# Patient Record
Sex: Female | Born: 1935 | Race: White | Hispanic: No | Smoking: Former smoker
Health system: Southern US, Community
[De-identification: ages and names within clinical notes are randomized; demographics above are authoritative.]

## PROBLEM LIST (undated history)

## (undated) DIAGNOSIS — I350 Nonrheumatic aortic (valve) stenosis: Secondary | ICD-10-CM

## (undated) DIAGNOSIS — M8580 Other specified disorders of bone density and structure, unspecified site: Secondary | ICD-10-CM

## (undated) DIAGNOSIS — I700 Atherosclerosis of aorta: Secondary | ICD-10-CM

## (undated) DIAGNOSIS — F32A Depression, unspecified: Secondary | ICD-10-CM

## (undated) DIAGNOSIS — N183 Chronic kidney disease, stage 3 unspecified: Secondary | ICD-10-CM

## (undated) DIAGNOSIS — E039 Hypothyroidism, unspecified: Secondary | ICD-10-CM

## (undated) DIAGNOSIS — K219 Gastro-esophageal reflux disease without esophagitis: Secondary | ICD-10-CM

## (undated) DIAGNOSIS — Z9889 Other specified postprocedural states: Secondary | ICD-10-CM

## (undated) DIAGNOSIS — I6523 Occlusion and stenosis of bilateral carotid arteries: Secondary | ICD-10-CM

## (undated) DIAGNOSIS — M1990 Unspecified osteoarthritis, unspecified site: Secondary | ICD-10-CM

## (undated) DIAGNOSIS — I10 Essential (primary) hypertension: Secondary | ICD-10-CM

## (undated) DIAGNOSIS — Z9289 Personal history of other medical treatment: Secondary | ICD-10-CM

## (undated) HISTORY — DX: Other specified postprocedural states: Z98.890

## (undated) HISTORY — DX: Occlusion and stenosis of bilateral carotid arteries: I65.23

## (undated) HISTORY — DX: Hypothyroidism, unspecified: E03.9

## (undated) HISTORY — DX: Unspecified osteoarthritis, unspecified site: M19.90

## (undated) HISTORY — DX: Gastro-esophageal reflux disease without esophagitis: K21.9

## (undated) HISTORY — DX: Atherosclerosis of aorta: I70.0

## (undated) HISTORY — PX: APPENDECTOMY: SHX54

## (undated) HISTORY — DX: Other specified disorders of bone density and structure, unspecified site: M85.80

## (undated) HISTORY — DX: Depression, unspecified: F32.A

## (undated) HISTORY — DX: Nonrheumatic aortic (valve) stenosis: I35.0

## (undated) HISTORY — DX: Personal history of other medical treatment: Z92.89

## (undated) HISTORY — PX: EYE SURGERY: SHX253

## (undated) HISTORY — PX: OTHER SURGICAL HISTORY: SHX169

## (undated) HISTORY — DX: Chronic kidney disease, stage 3 unspecified: N18.30

---

## 2009-12-09 DIAGNOSIS — D492 Neoplasm of unspecified behavior of bone, soft tissue, and skin: Secondary | ICD-10-CM | POA: Insufficient documentation

## 2009-12-21 DIAGNOSIS — M6740 Ganglion, unspecified site: Secondary | ICD-10-CM | POA: Insufficient documentation

## 2009-12-21 DIAGNOSIS — M19049 Primary osteoarthritis, unspecified hand: Secondary | ICD-10-CM | POA: Insufficient documentation

## 2014-08-24 DIAGNOSIS — G5621 Lesion of ulnar nerve, right upper limb: Secondary | ICD-10-CM | POA: Insufficient documentation

## 2015-09-23 DIAGNOSIS — E785 Hyperlipidemia, unspecified: Secondary | ICD-10-CM | POA: Insufficient documentation

## 2015-09-23 DIAGNOSIS — I10 Essential (primary) hypertension: Secondary | ICD-10-CM | POA: Insufficient documentation

## 2016-03-17 DIAGNOSIS — F32A Depression, unspecified: Secondary | ICD-10-CM | POA: Insufficient documentation

## 2016-03-17 DIAGNOSIS — G629 Polyneuropathy, unspecified: Secondary | ICD-10-CM | POA: Insufficient documentation

## 2016-03-17 DIAGNOSIS — F329 Major depressive disorder, single episode, unspecified: Secondary | ICD-10-CM | POA: Insufficient documentation

## 2016-11-07 DIAGNOSIS — M8580 Other specified disorders of bone density and structure, unspecified site: Secondary | ICD-10-CM | POA: Insufficient documentation

## 2017-10-23 HISTORY — PX: ANKLE SURGERY: SHX546

## 2018-04-16 DIAGNOSIS — E039 Hypothyroidism, unspecified: Secondary | ICD-10-CM | POA: Insufficient documentation

## 2018-04-16 DIAGNOSIS — S82853A Displaced trimalleolar fracture of unspecified lower leg, initial encounter for closed fracture: Secondary | ICD-10-CM | POA: Insufficient documentation

## 2018-04-16 DIAGNOSIS — S82851A Displaced trimalleolar fracture of right lower leg, initial encounter for closed fracture: Secondary | ICD-10-CM | POA: Insufficient documentation

## 2018-06-21 ENCOUNTER — Encounter: Payer: Self-pay | Admitting: Podiatry

## 2018-06-21 ENCOUNTER — Ambulatory Visit (INDEPENDENT_AMBULATORY_CARE_PROVIDER_SITE_OTHER): Payer: Medicare Other | Admitting: Podiatry

## 2018-06-21 ENCOUNTER — Ambulatory Visit (INDEPENDENT_AMBULATORY_CARE_PROVIDER_SITE_OTHER): Payer: Medicare Other

## 2018-06-21 DIAGNOSIS — S82891A Other fracture of right lower leg, initial encounter for closed fracture: Secondary | ICD-10-CM

## 2018-06-23 NOTE — Progress Notes (Signed)
   Subjective:  Patient presents today status post ORIF right ankle. DOS: 04/17/18. Patient moved here two weeks ago and needs follow up post op care. She states she slipped and fell on 04/16/18, fracturing the ankle and had surgery the next day. She states she had two weeks of rehab and has not taken any medication in two weeks. She states the pain was tolerable with Percocet and Tylenol. Patient is here for further evaluation and treatment.   No past medical history on file.    Objective/Physical Exam Neurovascular status intact.  Skin incisions appear to be well coapted. No sign of infectious process noted. No dehiscence. No active bleeding noted. Moderate edema noted to the surgical extremity.  Radiographic Exam:  Orthopedic hardware and osteotomies sites appear to be stable with routine healing.  Assessment: 1. s/p ORIF right ankle. DOS: 04/17/18   Plan of Care:  1. Patient was evaluated. X-rays reviewed 2. Begin weightbearing in CAM boot. Discontinue using walker.  3. Transition into good shoe gear in 2 weeks.  4. Return to clinic in 4 weeks.    Edrick Kins, DPM [facility]  Dr. Edrick Kins, [address]                                        [place], [place] [address]                Office [phone]  Fax [phone]

## 2018-07-19 ENCOUNTER — Encounter: Payer: Self-pay | Admitting: Podiatry

## 2018-07-19 ENCOUNTER — Ambulatory Visit (INDEPENDENT_AMBULATORY_CARE_PROVIDER_SITE_OTHER): Payer: Medicare Other | Admitting: Podiatry

## 2018-07-19 ENCOUNTER — Ambulatory Visit (INDEPENDENT_AMBULATORY_CARE_PROVIDER_SITE_OTHER): Payer: Medicare Other

## 2018-07-19 DIAGNOSIS — S82891D Other fracture of right lower leg, subsequent encounter for closed fracture with routine healing: Secondary | ICD-10-CM

## 2018-07-21 NOTE — Progress Notes (Signed)
   Subjective:  Patient presents today for follow-up evaluation status post ORIF right ankle. DOS: 04/17/18.  Patient continues to have some tenderness to the area during ambulation in the immobilization cam boot.  Otherwise patient is doing well.  No new complaints at this time.   No past medical history on file.    Objective/Physical Exam Neurovascular status intact.  Skin incisions appear to be well coapted. No sign of infectious process noted. No dehiscence. No active bleeding noted. Moderate edema noted to the surgical extremity.  Radiographic Exam:  Orthopedic hardware and osteotomies sites appear to be stable with routine healing.  There appears to be complete healing of the fracture site to the right ankle.  Assessment: 1. s/p ORIF right ankle. DOS: 04/17/18   Plan of Care:  1. Patient was evaluated. X-rays reviewed 2.  Discontinue immobilization cam boot.  Recommend good supportive shoe gear at this time. 3.  Patient can begin full activity with no restrictions.  Recommend slowly transitioning into higher activities over the course of the next 4 weeks. 4.  Return to clinic as needed  *Patient goes by Luberta Mutter, DPM [facility]  Dr. Edrick Kins, [address]                                        [place], [place] [address]                Office [phone]  Fax [phone]

## 2019-03-11 DIAGNOSIS — F3340 Major depressive disorder, recurrent, in remission, unspecified: Secondary | ICD-10-CM | POA: Insufficient documentation

## 2019-03-11 DIAGNOSIS — E7800 Pure hypercholesterolemia, unspecified: Secondary | ICD-10-CM | POA: Insufficient documentation

## 2019-03-11 DIAGNOSIS — K219 Gastro-esophageal reflux disease without esophagitis: Secondary | ICD-10-CM | POA: Insufficient documentation

## 2021-01-13 ENCOUNTER — Other Ambulatory Visit: Payer: Self-pay | Admitting: Internal Medicine

## 2021-01-13 DIAGNOSIS — R221 Localized swelling, mass and lump, neck: Secondary | ICD-10-CM

## 2021-02-02 ENCOUNTER — Other Ambulatory Visit: Payer: Self-pay

## 2021-02-02 ENCOUNTER — Ambulatory Visit
Admission: RE | Admit: 2021-02-02 | Discharge: 2021-02-02 | Disposition: A | Discharge status: Home / Self Care | Payer: Medicare Other | Source: Ambulatory Visit | Attending: Internal Medicine | Admitting: Internal Medicine

## 2021-02-02 DIAGNOSIS — R221 Localized swelling, mass and lump, neck: Secondary | ICD-10-CM | POA: Diagnosis not present

## 2021-02-02 HISTORY — DX: Essential (primary) hypertension: I10

## 2021-02-02 MED ORDER — IOHEXOL 300 MG/ML  SOLN
75.0000 mL | Freq: Once | INTRAMUSCULAR | Status: AC | PRN
  Administered 2021-02-02: 75 mL via INTRAVENOUS

## 2021-02-14 ENCOUNTER — Other Ambulatory Visit: Payer: Self-pay | Admitting: Internal Medicine

## 2021-02-14 DIAGNOSIS — M542 Cervicalgia: Secondary | ICD-10-CM

## 2021-03-17 ENCOUNTER — Other Ambulatory Visit (INDEPENDENT_AMBULATORY_CARE_PROVIDER_SITE_OTHER): Payer: Self-pay | Admitting: Vascular Surgery

## 2021-03-17 DIAGNOSIS — I6523 Occlusion and stenosis of bilateral carotid arteries: Secondary | ICD-10-CM

## 2021-03-22 ENCOUNTER — Ambulatory Visit (INDEPENDENT_AMBULATORY_CARE_PROVIDER_SITE_OTHER): Payer: Medicare Other

## 2021-03-22 ENCOUNTER — Other Ambulatory Visit: Payer: Self-pay

## 2021-03-22 ENCOUNTER — Encounter (INDEPENDENT_AMBULATORY_CARE_PROVIDER_SITE_OTHER): Payer: Self-pay | Admitting: Vascular Surgery

## 2021-03-22 ENCOUNTER — Ambulatory Visit (INDEPENDENT_AMBULATORY_CARE_PROVIDER_SITE_OTHER): Payer: Medicare Other | Admitting: Vascular Surgery

## 2021-03-22 VITALS — BP 151/72 | HR 65 | Resp 16 | Ht 66.0 in | Wt 138.2 lb

## 2021-03-22 DIAGNOSIS — I6523 Occlusion and stenosis of bilateral carotid arteries: Secondary | ICD-10-CM | POA: Diagnosis not present

## 2021-03-22 DIAGNOSIS — I10 Essential (primary) hypertension: Secondary | ICD-10-CM

## 2021-03-22 DIAGNOSIS — E7800 Pure hypercholesterolemia, unspecified: Secondary | ICD-10-CM

## 2021-03-22 DIAGNOSIS — I6521 Occlusion and stenosis of right carotid artery: Secondary | ICD-10-CM

## 2021-03-22 DIAGNOSIS — I6529 Occlusion and stenosis of unspecified carotid artery: Secondary | ICD-10-CM | POA: Insufficient documentation

## 2021-03-22 MED ORDER — CLOPIDOGREL BISULFATE 75 MG PO TABS: 75.0000 mg | ORAL_TABLET | Freq: Every day | ORAL | 6 refills | Status: DC

## 2021-03-22 NOTE — Assessment & Plan Note (Signed)
The patient has carotid stenosis in the 60 to 79% range by duplex and likely greater than 70% range by CT scan.  Although she is 26, she is highly functional and lives independently.  At this point, we have a couple of decisions to make.  For stenosis of greater than 70 to 75%, intervention is usually beneficial for stroke risk reduction.  Given her overall good health status, even with her advanced age I would certainly consider that for her.  With her findings on the studies above, it sounds like her lesion is borderline for high-grade and 1 option would be a short interval follow-up with the addition of Plavix as an antiplatelet to her regimen.  The other option would be an angiogram with intent to treat if significant stenosis of greater than 70 to 75% is seen on the right side.  She is unsure how she would like to proceed.  I will prescribe her Plavix.  She has a son-in-law whose brother is a Hydrographic surveyor who I am familiar with and know for my time in [place].  I will be happy to discuss the case with him or her children if they would like.  I given her my cell phone number today.

## 2021-03-22 NOTE — Progress Notes (Signed)
Patient ID: Tanya Larson, female   DOB: [birth year], 87 y.o.   MRN: [id number]  Chief Complaint  Patient presents with  . New Patient (Initial Visit)    Ref Hande right carotid plaque   HPI Tanya Larson is a 87 y.o. female.  I am asked to see the patient by Dr. Ginette Pitman for evaluation of carotid stenosis.  The patient reports her only symptom is neck pain which prompted a CT scan of the neck which I have reviewed.  This was not a CT angiogram and evaluation was limited, but there definitely appeared to be a significant right carotid artery stenosis in the 70% or greater range by my review.  This prompted a carotid duplex performed in our office today which demonstrated 60 to 79% right ICA stenosis and 1 to 39% left ICA stenosis.  The patient has not had any focal neurologic symptoms. Specifically, the patient denies amaurosis fugax, speech or swallowing difficulties, or arm or leg weakness or numbness    Past Medical History:  Diagnosis Date  . Hypertension      Family History  Problem Relation Age of Onset  . Aortic stenosis Mother   . Congestive Heart Failure Brother   . Suicidality Brother   no bleeding or clotting disorders   Social History   Tobacco Use  . Smoking status: Former Research scientist (life sciences)  . Smokeless tobacco: Never Used  Substance Use Topics  . Alcohol use: Yes    Comment: social drinker  . Drug use: Never     Allergies  Allergen Reactions  . Erythromycin Hives    Mouth breaks out    Current Outpatient Medications  Medication Sig Dispense Refill  . ALPRAZolam (XANAX) 0.5 MG tablet Take by mouth.    Marland Kitchen amLODipine (NORVASC) 2.5 MG tablet Take 2.5 mg by mouth daily.    . clopidogrel (PLAVIX) 75 MG tablet Take 1 tablet (75 mg total) by mouth daily. 30 tablet 6  . escitalopram (LEXAPRO) 10 MG tablet Take 1 tablet by mouth daily.    . metoprolol tartrate (LOPRESSOR) 50 MG tablet Take 50 mg by mouth daily.    . pantoprazole (PROTONIX) 40 MG tablet Take 1 tablet by  mouth daily.    . pravastatin (PRAVACHOL) 20 MG tablet     . valsartan (DIOVAN) 40 MG tablet      No current facility-administered medications for this visit.      REVIEW OF SYSTEMS (Negative unless checked)  Constitutional: [] Weight loss  [] Fever  [] Chills Cardiac: [] Chest pain   [] Chest pressure   [] Palpitations   [] Shortness of breath when laying flat   [] Shortness of breath at rest   [] Shortness of breath with exertion. Vascular:  [] Pain in legs with walking   [] Pain in legs at rest   [] Pain in legs when laying flat   [] Claudication   [] Pain in feet when walking  [] Pain in feet at rest  [] Pain in feet when laying flat   [] History of DVT   [] Phlebitis   [] Swelling in legs   [] Varicose veins   [] Non-healing ulcers Pulmonary:   [] Uses home oxygen   [] Productive cough   [] Hemoptysis   [] Wheeze  [] COPD   [] Asthma Neurologic:  [] Dizziness  [] Blackouts   [] Seizures   [] History of stroke   [] History of TIA  [] Aphasia   [] Temporary blindness   [] Dysphagia   [] Weakness or numbness in arms   [] Weakness or numbness in legs Musculoskeletal:  [x] Arthritis   [] Joint swelling   [] Joint  pain   [] Low back pain Hematologic:  [] Easy bruising  [] Easy bleeding   [] Hypercoagulable state   [] Anemic  [] Hepatitis Gastrointestinal:  [] Blood in stool   [] Vomiting blood  [] Gastroesophageal reflux/heartburn   [] Abdominal pain Genitourinary:  [] Chronic kidney disease   [] Difficult urination  [] Frequent urination  [] Burning with urination   [] Hematuria Skin:  [] Rashes   [] Ulcers   [] Wounds Psychological:  [] History of anxiety   []  History of major depression.    Physical Exam BP (!) 151/72 (BP Location: Right Arm)   Pulse 65   Resp 16   Ht 5\' 6"  (1.676 m)   Wt 138 lb 3.2 oz (62.7 kg)   BMI 22.31 kg/m  Gen:  WD/WN, NAD. Appears far younger than stated age. Head: [place]/AT, No temporalis wasting.  Ear/Nose/Throat: Hearing grossly intact, nares w/o erythema or drainage, oropharynx w/o Erythema/Exudate Eyes:  Conjunctiva clear, sclera non-icteric  Neck: trachea midline.  Right carotid bruit Pulmonary:  Good air movement, clear to auscultation bilaterally.  Cardiac: RRR, normal S1, S2 Vascular:  Vessel Right Left  Radial Palpable Palpable       Musculoskeletal: M/S 5/5 throughout.  Extremities without ischemic changes.  No deformity or atrophy. No edema. Neurologic: Sensation grossly intact in extremities.  Symmetrical.  Speech is fluent. Motor exam as listed above. Psychiatric: Judgment intact, Mood & affect appropriate for pt's clinical situation. Dermatologic: No rashes or ulcers noted.  No cellulitis or open wounds. Lymph : No Cervical, Axillary, or Inguinal lymphadenopathy.   Radiology No results found.  Labs No results found for this or any previous visit (from the past 2160 hour(s)).  Assessment/Plan:  Carotid stenosis The patient has carotid stenosis in the 60 to 79% range by duplex and likely greater than 70% range by CT scan.  Although she is 66, she is highly functional and lives independently.  At this point, we have a couple of decisions to make.  For stenosis of greater than 70 to 75%, intervention is usually beneficial for stroke risk reduction.  Given her overall good health status, even with her advanced age I would certainly consider that for her.  With her findings on the studies above, it sounds like her lesion is borderline for high-grade and 1 option would be a short interval follow-up with the addition of Plavix as an antiplatelet to her regimen.  The other option would be an angiogram with intent to treat if significant stenosis of greater than 70 to 75% is seen on the right side.  She is unsure how she would like to proceed.  I will prescribe her Plavix.  She has a son-in-law whose brother is a Hydrographic surveyor who I am familiar with and know for my time in [place].  I will be happy to discuss the case with him or her children if they would like.  I given her my cell  phone number today.  Hypertensive disorder blood pressure control important in reducing the progression of atherosclerotic disease. On appropriate oral medications.   Pure hypercholesterolemia lipid control important in reducing the progression of atherosclerotic disease. Continue statin therapy       Leotis Pain 03/22/2021, 4:39 PM   This note was created with Dragon medical transcription system.  Any errors from dictation are unintentional.

## 2021-03-22 NOTE — Assessment & Plan Note (Signed)
blood pressure control important in reducing the progression of atherosclerotic disease. On appropriate oral medications.  

## 2021-03-22 NOTE — Assessment & Plan Note (Signed)
lipid control important in reducing the progression of atherosclerotic disease. Continue statin therapy  

## 2021-04-29 ENCOUNTER — Ambulatory Visit (INDEPENDENT_AMBULATORY_CARE_PROVIDER_SITE_OTHER): Payer: Medicare Other | Admitting: Vascular Surgery

## 2021-04-29 ENCOUNTER — Other Ambulatory Visit: Payer: Self-pay

## 2021-04-29 VITALS — BP 132/73 | HR 60 | Resp 16 | Wt 133.6 lb

## 2021-04-29 DIAGNOSIS — I6523 Occlusion and stenosis of bilateral carotid arteries: Secondary | ICD-10-CM | POA: Diagnosis not present

## 2021-04-29 DIAGNOSIS — E7800 Pure hypercholesterolemia, unspecified: Secondary | ICD-10-CM | POA: Diagnosis not present

## 2021-04-29 DIAGNOSIS — I10 Essential (primary) hypertension: Secondary | ICD-10-CM | POA: Diagnosis not present

## 2021-04-29 DIAGNOSIS — I6521 Occlusion and stenosis of right carotid artery: Secondary | ICD-10-CM | POA: Diagnosis not present

## 2021-04-29 NOTE — Progress Notes (Signed)
MRN : [id number]  Tanya Larson is a 87 y.o. ([birth year]) female who presents with chief complaint of  Chief Complaint  Patient presents with   Follow-up    Discuss procedure  .  History of Present Illness: Patient returns today in follow up of her carotid disease.  She is doing well today without any complaints.  She is tolerating Plavix with some mild increase in bruising.  I discussed her situation with a family member who is a Hydrographic surveyor in [place] at her request.  After this discussions and discussions among her family, she has elected to continue conservative measures which I think is very reasonable.  We had offered her duplex short interval follow-up versus an angiogram with possible intervention if anatomy was appropriate.  Current Outpatient Medications  Medication Sig Dispense Refill   ALPRAZolam (XANAX) 0.5 MG tablet Take by mouth.     amLODipine (NORVASC) 2.5 MG tablet Take 2.5 mg by mouth daily.     clopidogrel (PLAVIX) 75 MG tablet Take 1 tablet (75 mg total) by mouth daily. 30 tablet 6   escitalopram (LEXAPRO) 10 MG tablet Take 1 tablet by mouth daily.     metoprolol tartrate (LOPRESSOR) 50 MG tablet Take 50 mg by mouth daily.     pantoprazole (PROTONIX) 40 MG tablet Take 1 tablet by mouth daily.     pravastatin (PRAVACHOL) 20 MG tablet      valsartan (DIOVAN) 40 MG tablet      No current facility-administered medications for this visit.    Past Medical History:  Diagnosis Date   Hypertension       Social History   Tobacco Use   Smoking status: Former    Pack years: 0.00   Smokeless tobacco: Never  Substance Use Topics   Alcohol use: Yes    Comment: social drinker   Drug use: Never      Family History  Problem Relation Age of Onset   Aortic stenosis Mother    Congestive Heart Failure Brother    Suicidality Brother      Allergies  Allergen Reactions   Erythromycin Hives    Mouth breaks out     REVIEW OF SYSTEMS (Negative  unless checked)  Constitutional: [] Weight loss  [] Fever  [] Chills Cardiac: [] Chest pain   [] Chest pressure   [] Palpitations   [] Shortness of breath when laying flat   [] Shortness of breath at rest   [] Shortness of breath with exertion. Vascular:  [] Pain in legs with walking   [] Pain in legs at rest   [] Pain in legs when laying flat   [] Claudication   [] Pain in feet when walking  [] Pain in feet at rest  [] Pain in feet when laying flat   [] History of DVT   [] Phlebitis   [] Swelling in legs   [] Varicose veins   [] Non-healing ulcers Pulmonary:   [] Uses home oxygen   [] Productive cough   [] Hemoptysis   [] Wheeze  [] COPD   [] Asthma Neurologic:  [] Dizziness  [] Blackouts   [] Seizures   [] History of stroke   [] History of TIA  [] Aphasia   [] Temporary blindness   [] Dysphagia   [] Weakness or numbness in arms   [] Weakness or numbness in legs Musculoskeletal:  [x] Arthritis   [] Joint swelling   [] Joint pain   [] Low back pain Hematologic:  [x] Easy bruising  [] Easy bleeding   [] Hypercoagulable state   [] Anemic   Gastrointestinal:  [] Blood in stool   [] Vomiting blood  [] Gastroesophageal reflux/heartburn   [] Abdominal pain Genitourinary:  []   Chronic kidney disease   [] Difficult urination  [] Frequent urination  [] Burning with urination   [] Hematuria Skin:  [] Rashes   [] Ulcers   [] Wounds Psychological:  [] History of anxiety   []  History of major depression.  Physical Examination  BP 132/73 (BP Location: Right Arm)   Pulse 60   Resp 16   Wt 133 lb 9.6 oz (60.6 kg)   BMI 21.56 kg/m  Gen:  WD/WN, NAD. Appears far younger than stated age. Head: [place]/AT, No temporalis wasting. Ear/Nose/Throat: Hearing grossly intact, nares w/o erythema or drainage Eyes: Conjunctiva clear. Sclera non-icteric Neck: Supple.  Trachea midline Pulmonary:  Good air movement, no use of accessory muscles.  Cardiac: RRR, no JVD Vascular:  Vessel Right Left  Radial Palpable Palpable               Musculoskeletal: M/S 5/5 throughout.  No  deformity or atrophy. No edema. Neurologic: Sensation grossly intact in extremities.  Symmetrical.  Speech is fluent.  Psychiatric: Judgment intact, Mood & affect appropriate for pt's clinical situation. Dermatologic: No rashes or ulcers noted.  No cellulitis or open wounds.      Labs No results found for this or any previous visit (from the past 2160 hour(s)).  Radiology No results found.  Assessment/Plan Hypertensive disorder blood pressure control important in reducing the progression of atherosclerotic disease. On appropriate oral medications.     Pure hypercholesterolemia lipid control important in reducing the progression of atherosclerotic disease. Continue statin therapy  Carotid stenosis The patient has a borderline high-grade right carotid stenosis without focal symptoms.  At this point, we have added Plavix to her medical therapy and she is tolerating this well.  No new symptoms.  I will plan a follow-up in about 3 months with duplex.    Leotis Pain, MD  04/29/2021 10:06 AM    This note was created with Dragon medical transcription system.  Any errors from dictation are purely unintentional

## 2021-04-29 NOTE — Assessment & Plan Note (Signed)
The patient has a borderline high-grade right carotid stenosis without focal symptoms.  At this point, we have added Plavix to her medical therapy and she is tolerating this well.  No new symptoms.  I will plan a follow-up in about 3 months with duplex.

## 2021-05-03 ENCOUNTER — Ambulatory Visit (INDEPENDENT_AMBULATORY_CARE_PROVIDER_SITE_OTHER): Payer: Medicare Other

## 2021-06-06 ENCOUNTER — Other Ambulatory Visit (INDEPENDENT_AMBULATORY_CARE_PROVIDER_SITE_OTHER): Payer: Self-pay | Admitting: Nurse Practitioner

## 2021-06-06 MED ORDER — CLOPIDOGREL BISULFATE 75 MG PO TABS: 75.0000 mg | ORAL_TABLET | Freq: Every day | ORAL | 3 refills | Status: DC

## 2021-06-16 DIAGNOSIS — I6523 Occlusion and stenosis of bilateral carotid arteries: Secondary | ICD-10-CM | POA: Insufficient documentation

## 2021-08-05 ENCOUNTER — Ambulatory Visit (INDEPENDENT_AMBULATORY_CARE_PROVIDER_SITE_OTHER): Payer: Medicare Other | Admitting: Vascular Surgery

## 2021-08-05 ENCOUNTER — Encounter (INDEPENDENT_AMBULATORY_CARE_PROVIDER_SITE_OTHER): Payer: Self-pay | Admitting: Vascular Surgery

## 2021-08-05 ENCOUNTER — Other Ambulatory Visit: Payer: Self-pay

## 2021-08-05 ENCOUNTER — Ambulatory Visit (INDEPENDENT_AMBULATORY_CARE_PROVIDER_SITE_OTHER): Payer: Medicare Other

## 2021-08-05 VITALS — BP 165/71 | HR 61 | Resp 16 | Wt 138.4 lb

## 2021-08-05 DIAGNOSIS — E7800 Pure hypercholesterolemia, unspecified: Secondary | ICD-10-CM

## 2021-08-05 DIAGNOSIS — I6523 Occlusion and stenosis of bilateral carotid arteries: Secondary | ICD-10-CM | POA: Diagnosis not present

## 2021-08-05 DIAGNOSIS — I6521 Occlusion and stenosis of right carotid artery: Secondary | ICD-10-CM

## 2021-08-05 DIAGNOSIS — I10 Essential (primary) hypertension: Secondary | ICD-10-CM

## 2021-08-05 NOTE — Assessment & Plan Note (Signed)
Duplex today shows stable 60 to 79% ICA stenosis bilaterally without significant progression.  Left ICA stenosis is in the 1 to 39% range.  Continue current medical regimen including statin agent and antiplatelet therapy.  Recheck in 6 months.

## 2021-08-05 NOTE — Assessment & Plan Note (Signed)
blood pressure control important in reducing the progression of atherosclerotic disease. On appropriate oral medications.  

## 2021-08-05 NOTE — Progress Notes (Signed)
MRN : [id number]  Tanya Larson is a 87 y.o. ([birth year]) female who presents with chief complaint of  Chief Complaint  Patient presents with   Follow-up    Ultrasound follow up  .  History of Present Illness: Patient returns in follow-up of her carotid disease.  She is doing well.  She denies any focal neurologic symptoms or other issues since her last visit. Specifically, the patient denies amaurosis fugax, speech or swallowing difficulties, or arm or leg weakness or numbness. Duplex today shows stable 60 to 79% ICA stenosis bilaterally without significant progression.  Left ICA stenosis is in the 1 to 39% range.   Current Outpatient Medications  Medication Sig Dispense Refill   ALPRAZolam (XANAX) 0.5 MG tablet Take by mouth.     amLODipine (NORVASC) 2.5 MG tablet Take 2.5 mg by mouth daily.     clopidogrel (PLAVIX) 75 MG tablet Take 1 tablet (75 mg total) by mouth daily. 90 tablet 3   escitalopram (LEXAPRO) 10 MG tablet Take 1 tablet by mouth daily.     metoprolol tartrate (LOPRESSOR) 50 MG tablet Take 50 mg by mouth daily.     pantoprazole (PROTONIX) 40 MG tablet Take 1 tablet by mouth daily.     pravastatin (PRAVACHOL) 20 MG tablet      valsartan (DIOVAN) 40 MG tablet      No current facility-administered medications for this visit.    Past Medical History:  Diagnosis Date   Hypertension      Social History   Tobacco Use   Smoking status: Former   Smokeless tobacco: Never  Substance Use Topics   Alcohol use: Yes    Comment: social drinker   Drug use: Never      Family History  Problem Relation Age of Onset   Aortic stenosis Mother    Congestive Heart Failure Brother    Suicidality Brother     Allergies  Allergen Reactions   Erythromycin Hives    Mouth breaks out     REVIEW OF SYSTEMS (Negative unless checked)  Constitutional: [] Weight loss  [] Fever  [] Chills Cardiac: [] Chest pain   [] Chest pressure   [] Palpitations   [] Shortness of breath when  laying flat   [] Shortness of breath at rest   [] Shortness of breath with exertion. Vascular:  [] Pain in legs with walking   [] Pain in legs at rest   [] Pain in legs when laying flat   [] Claudication   [] Pain in feet when walking  [] Pain in feet at rest  [] Pain in feet when laying flat   [] History of DVT   [] Phlebitis   [] Swelling in legs   [] Varicose veins   [] Non-healing ulcers Pulmonary:   [] Uses home oxygen   [] Productive cough   [] Hemoptysis   [] Wheeze  [] COPD   [] Asthma Neurologic:  [] Dizziness  [] Blackouts   [] Seizures   [] History of stroke   [] History of TIA  [] Aphasia   [] Temporary blindness   [] Dysphagia   [] Weakness or numbness in arms   [] Weakness or numbness in legs Musculoskeletal:  [] Arthritis   [] Joint swelling   [] Joint pain   [] Low back pain Hematologic:  [] Easy bruising  [] Easy bleeding   [] Hypercoagulable state   [] Anemic  [] Hepatitis Gastrointestinal:  [] Blood in stool   [] Vomiting blood  [] Gastroesophageal reflux/heartburn   [] Difficulty swallowing. Genitourinary:  [] Chronic kidney disease   [] Difficult urination  [] Frequent urination  [] Burning with urination   [] Blood in urine Skin:  [] Rashes   [] Ulcers   [] Wounds  Psychological:  [] History of anxiety   []  History of major depression.  Physical Examination  Vitals:   08/05/21 1028  BP: (!) 165/71  Pulse: 61  Resp: 16  Weight: 138 lb 6.4 oz (62.8 kg)   Body mass index is 22.34 kg/m. Gen:  WD/WN, NAD.  Appears much younger than stated age Head: [place]/AT, No temporalis wasting. Ear/Nose/Throat: Hearing grossly intact, nares w/o erythema or drainage, trachea midline Eyes: Conjunctiva clear. Sclera non-icteric Neck: Supple.  Right carotid bruit  Pulmonary:  Good air movement, equal and clear to auscultation bilaterally.  Cardiac: RRR, No JVD Vascular:  Vessel Right Left  Radial Palpable Palpable               Musculoskeletal: M/S 5/5 throughout.  No deformity or atrophy.  No significant lower extremity  edema. Neurologic: CN 2-12 intact. Sensation grossly intact in extremities.  Symmetrical.  Speech is fluent. Motor exam as listed above. Psychiatric: Judgment intact, Mood & affect appropriate for pt's clinical situation. Dermatologic: No rashes or ulcers noted.  No cellulitis or open wounds.     CBC No results found for: WBC, HGB, HCT, MCV, PLT  BMET No results found for: NA, K, CL, CO2, GLUCOSE, BUN, CREATININE, CALCIUM, GFRNONAA, GFRAA CrCl cannot be calculated (No successful lab value found.).  COAG No results found for: INR, PROTIME  Radiology No results found.   Assessment/Plan Hypertensive disorder blood pressure control important in reducing the progression of atherosclerotic disease. On appropriate oral medications.   Pure hypercholesterolemia lipid control important in reducing the progression of atherosclerotic disease. Continue statin therapy   Carotid stenosis Duplex today shows stable 60 to 79% ICA stenosis bilaterally without significant progression.  Left ICA stenosis is in the 1 to 39% range.  Continue current medical regimen including statin agent and antiplatelet therapy.  Recheck in 6 months.    Leotis Pain, MD  08/05/2021 11:26 AM    This note was created with Dragon medical transcription system.  Any errors from dictation are purely unintentional

## 2021-08-05 NOTE — Assessment & Plan Note (Signed)
lipid control important in reducing the progression of atherosclerotic disease. Continue statin therapy  

## 2021-09-27 ENCOUNTER — Ambulatory Visit (INDEPENDENT_AMBULATORY_CARE_PROVIDER_SITE_OTHER): Payer: Medicare Other | Admitting: Podiatry

## 2021-09-27 ENCOUNTER — Ambulatory Visit: Payer: Medicare Other

## 2021-09-27 ENCOUNTER — Other Ambulatory Visit: Payer: Self-pay

## 2021-09-27 DIAGNOSIS — S93601A Unspecified sprain of right foot, initial encounter: Secondary | ICD-10-CM

## 2021-09-27 NOTE — Progress Notes (Signed)
   HPI: 87 y.o. female presenting today for evaluation of an injury that the patient sustained about 2 weeks ago.  She does not know the exact date.  She says that she tripped and fell at home and injured her right foot.  She has not done anything for treatment at the moment.  She says the pain can get up to a 6/10 almost on daily basis.  She presents for follow-up treatment and evaluation  Past Medical History:  Diagnosis Date   Hypertension      Physical Exam: General: The patient is alert and oriented x3 in no acute distress.  Dermatology: Skin is warm, dry and supple bilateral lower extremities. Negative for open lesions or macerations.  Vascular: Palpable pedal pulses bilaterally. No edema or erythema noted. Capillary refill within normal limits.  Neurological: Epicritic and protective threshold grossly intact bilaterally.   Musculoskeletal Exam: Range of motion within normal limits to all pedal and ankle joints bilateral. Muscle strength 5/5 in all groups bilateral.  There is some tenderness to palpation along the lateral column of the foot  Radiographic Exam:  Diffuse degenerative changes noted throughout the joints of the foot which is within normal limits given the patient's age.  Osteopenia noted.  No fractures identified  Assessment: 1.  Foot sprain right; initial encounter   Plan of Care:  1. Patient evaluated. X-Rays reviewed.  2.  Cam boot dispensed.  Wear daily x2-3 weeks 3.  Continue OTC Tylenol as needed.  Patient is on anticoagulant medication no NSAIDs. 4.  Return to clinic as needed      Edrick Kins, DPM [facility]  Dr. Edrick Kins, DPM    [address], [place] [address]                Office [phone]  Fax [phone]

## 2021-12-15 DIAGNOSIS — I350 Nonrheumatic aortic (valve) stenosis: Secondary | ICD-10-CM | POA: Insufficient documentation

## 2022-01-31 ENCOUNTER — Other Ambulatory Visit (INDEPENDENT_AMBULATORY_CARE_PROVIDER_SITE_OTHER): Payer: Self-pay | Admitting: Vascular Surgery

## 2022-01-31 DIAGNOSIS — I6523 Occlusion and stenosis of bilateral carotid arteries: Secondary | ICD-10-CM

## 2022-02-03 ENCOUNTER — Encounter (INDEPENDENT_AMBULATORY_CARE_PROVIDER_SITE_OTHER): Payer: Self-pay | Admitting: Nurse Practitioner

## 2022-02-03 ENCOUNTER — Ambulatory Visit (INDEPENDENT_AMBULATORY_CARE_PROVIDER_SITE_OTHER): Payer: Medicare Other | Admitting: Nurse Practitioner

## 2022-02-03 ENCOUNTER — Ambulatory Visit (INDEPENDENT_AMBULATORY_CARE_PROVIDER_SITE_OTHER): Payer: Medicare Other

## 2022-02-03 VITALS — BP 153/74 | HR 65 | Resp 16 | Wt 139.4 lb

## 2022-02-03 DIAGNOSIS — I6523 Occlusion and stenosis of bilateral carotid arteries: Secondary | ICD-10-CM

## 2022-02-03 DIAGNOSIS — E7800 Pure hypercholesterolemia, unspecified: Secondary | ICD-10-CM | POA: Diagnosis not present

## 2022-02-03 DIAGNOSIS — I10 Essential (primary) hypertension: Secondary | ICD-10-CM

## 2022-02-19 ENCOUNTER — Encounter (INDEPENDENT_AMBULATORY_CARE_PROVIDER_SITE_OTHER): Payer: Self-pay | Admitting: Nurse Practitioner

## 2022-02-19 NOTE — Progress Notes (Signed)
? ?Subjective:  ? ? Patient ID: Tanya Larson, female    DOB: 09/03/1933, 88 y.o.   MRN: [id number] ?Chief Complaint  ?Patient presents with  ? Follow-up  ?  Ultrasound follow up  ? ? ?The patient is seen for follow up evaluation of carotid stenosis. The carotid stenosis followed by ultrasound.  ? ?The patient denies amaurosis fugax. There is no recent history of TIA symptoms or focal motor deficits. There is no prior documented CVA. ? ?The patient is taking enteric-coated aspirin 81 mg daily. ? ?There is no history of migraine headaches. There is no history of seizures. ? ?No recent shortening of the patient's walking distance or new symptoms consistent with claudication.  No history of rest pain symptoms. No new ulcers or wounds of the lower extremities have occurred. ? ?There is no history of DVT, PE or superficial thrombophlebitis. ?No recent episodes of angina or shortness of breath documented.  ? ?Carotid Duplex done today shows 60 to 79% stenosis of the right ICA with 1 to 39% stenosis of the left ICA.  The bilateral vertebral arteries demonstrate antegrade flow and the bilateral subclavian arteries have normal flow hemodynamics ? ? ?Review of Systems  ?All other systems reviewed and are negative. ? ?   ?Objective:  ? Physical Exam ?Vitals reviewed.  ?HENT:  ?   Head: Normocephalic.  ?Neck:  ?   Vascular: No carotid bruit.  ?Cardiovascular:  ?   Rate and Rhythm: Normal rate.  ?   Pulses:     ?     Radial pulses are 1+ on the right side and 1+ on the left side.  ?Pulmonary:  ?   Effort: Pulmonary effort is normal.  ?Skin: ?   General: Skin is warm and dry.  ?Neurological:  ?   Mental Status: She is alert and oriented to person, place, and time.  ?Psychiatric:     ?   Mood and Affect: Mood normal.     ?   Behavior: Behavior normal.     ?   Thought Content: Thought content normal.     ?   Judgment: Judgment normal.  ? ? ?BP (!) 153/74 (BP Location: Right Arm)   Pulse 65   Resp 16   Wt 139 lb 6.4 oz (63.2  kg)   BMI 22.50 kg/m?  ? ?Past Medical History:  ?Diagnosis Date  ? Hypertension   ? ? ?Social History  ? ?Socioeconomic History  ? Marital status: Unknown  ?  Spouse name: Not on file  ? Number of children: Not on file  ? Years of education: Not on file  ? Highest education level: Not on file  ?Occupational History  ? Not on file  ?Tobacco Use  ? Smoking status: Former  ? Smokeless tobacco: Never  ?Substance and Sexual Activity  ? Alcohol use: Yes  ?  Comment: social drinker  ? Drug use: Never  ? Sexual activity: Not on file  ?Other Topics Concern  ? Not on file  ?Social History Narrative  ? Not on file  ? ?Social Determinants of Health  ? ?Financial Resource Strain: Not on file  ?Food Insecurity: Not on file  ?Transportation Needs: Not on file  ?Physical Activity: Not on file  ?Stress: Not on file  ?Social Connections: Not on file  ?Intimate Partner Violence: Not on file  ? ? ?Past Surgical History:  ?Procedure Laterality Date  ? APPENDECTOMY    ? arm surgery Right   ? EYE SURGERY    ? ? ?  Family History  ?Problem Relation Age of Onset  ? Aortic stenosis Mother   ? Congestive Heart Failure Brother   ? Suicidality Brother   ? ? ?Allergies  ?Allergen Reactions  ? Erythromycin Hives  ?  Mouth breaks out  ? ? ?   ? View : No data to display.  ?  ?  ?  ? ? ? ? ?CMP  ?No results found for: NA, K, CL, CO2, GLUCOSE, BUN, CREATININE, CALCIUM, PROT, ALBUMIN, AST, ALT, ALKPHOS, BILITOT, GFRNONAA, GFRAA ? ? ?No results found. ? ?   ?Assessment & Plan:  ? ?1. Bilateral carotid artery stenosis ?Recommend: ? ?Given the patient's asymptomatic subcritical stenosis no further invasive testing or surgery at this time. ? ?Duplex ultrasound shows 60 to 79% stenosis of the right ICA with 1 to 39% of the left ICA ? ?Continue antiplatelet therapy as prescribed ?Continue management of CAD, HTN and Hyperlipidemia ?Healthy heart diet,  encouraged exercise at least 4 times per week ?Follow up in 6 months with duplex ultrasound and physical  exam   ? ?2. Pure hypercholesterolemia ?Continue statin as ordered and reviewed, no changes at this time  ? ?3. Primary hypertension ?Continue antihypertensive medications as already ordered, these medications have been reviewed and there are no changes at this time.  ? ? ?Current Outpatient Medications on File Prior to Visit  ?Medication Sig Dispense Refill  ? ALPRAZolam (XANAX) 0.5 MG tablet Take by mouth.    ? amLODipine (NORVASC) 2.5 MG tablet Take 2.5 mg by mouth daily.    ? clopidogrel (PLAVIX) 75 MG tablet Take 1 tablet (75 mg total) by mouth daily. 90 tablet 3  ? escitalopram (LEXAPRO) 10 MG tablet Take 1 tablet by mouth daily.    ? gabapentin (NEURONTIN) 300 MG capsule Take 300 mg by mouth daily.    ? levothyroxine (SYNTHROID) 100 MCG tablet Take 100 mcg by mouth daily.    ? metoprolol tartrate (LOPRESSOR) 50 MG tablet Take 50 mg by mouth daily.    ? pantoprazole (PROTONIX) 40 MG tablet Take 1 tablet by mouth daily.    ? pravastatin (PRAVACHOL) 20 MG tablet     ? valsartan (DIOVAN) 40 MG tablet     ? ?No current facility-administered medications on file prior to visit.  ? ? ?There are no Patient Instructions on file for this visit. ?No follow-ups on file. ? ? ?Kris Hartmann, NP ? ? ?

## 2022-05-01 IMAGING — CT CT NECK W/ CM
4 of 5 series · 14 of 33 positions shown, 16 images · IV contrast (omnipaque)
Comparison: No pertinent prior exams available for comparison.

CLINICAL DATA: Neck swelling. Additional history provided by
scanning technologist: Patient reports right-sided neck pain (worse
with bending over and looking straight down), symptoms for 5 years.
Patient reports a "popping" sound in neck. Vitamin-E marker placed
on "knot" in right mid neck area. Former smoker (quit 35 years ago).

EXAM:
CT NECK WITH CONTRAST
TECHNIQUE: Multidetector CT imaging of the neck was performed using the
standard protocol following the bolus administration of intravenous
contrast.
CONTRAST:  75mL OMNIPAQUE IOHEXOL 300 MG/ML  SOLN

[Series 3: axial bone neck 2.00 · axial · 0.55mm/px · z∈[-753,-627]mm · 3 of 127 slices shown]
[im 32/127  bone]
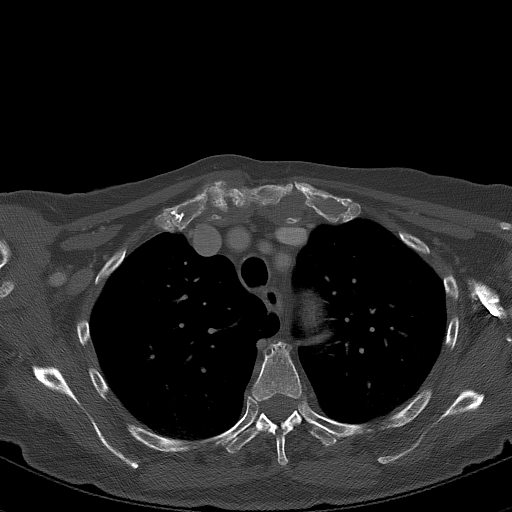
[im 64/127  bone]
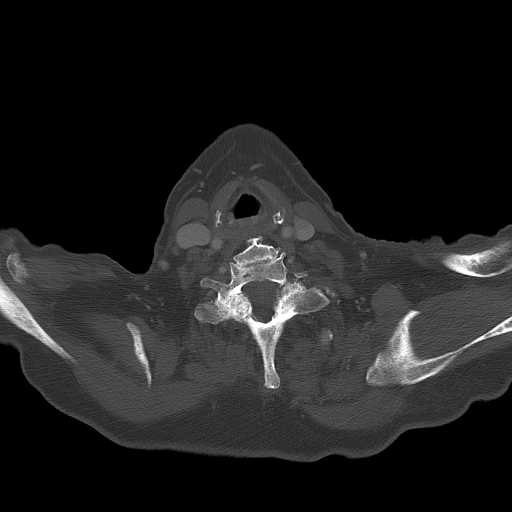
[im 95/127  bone]
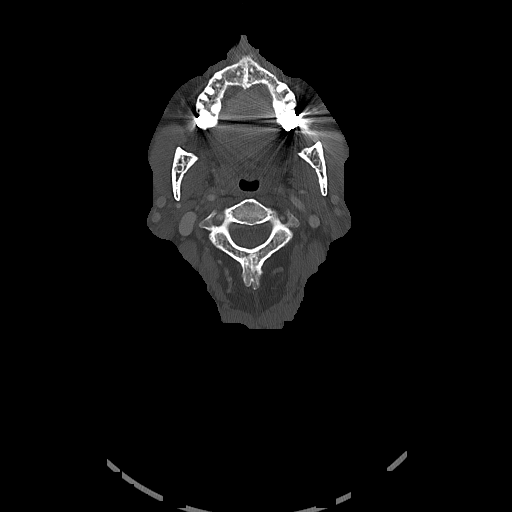

[Series 4: coronal neck neck (person_name) 2.00 cor · coronal · 0.52mm/px · 3 of 134 slices shown]
[im 27/134  bone]
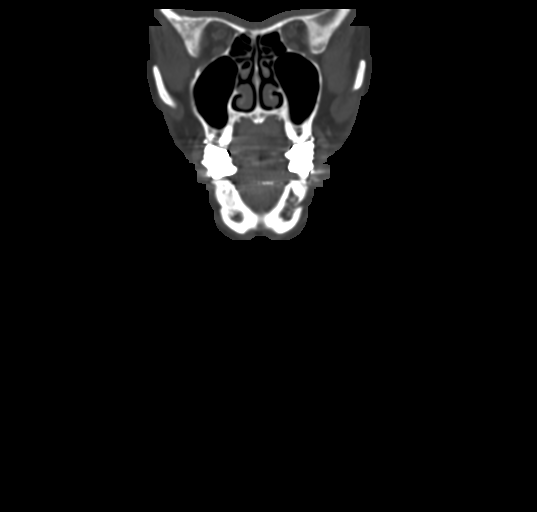
[im 54/134  bone]
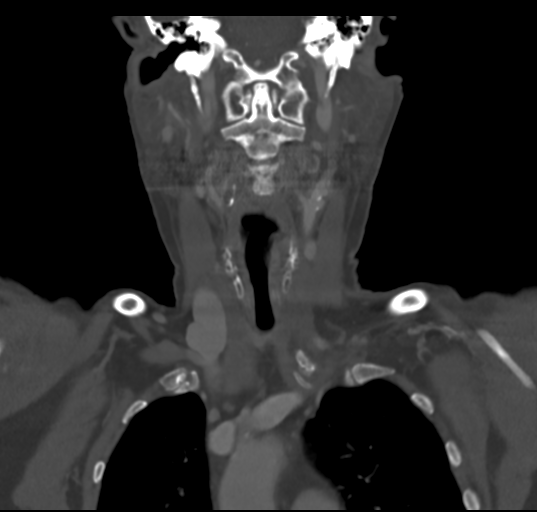
[im 80/134  bone]
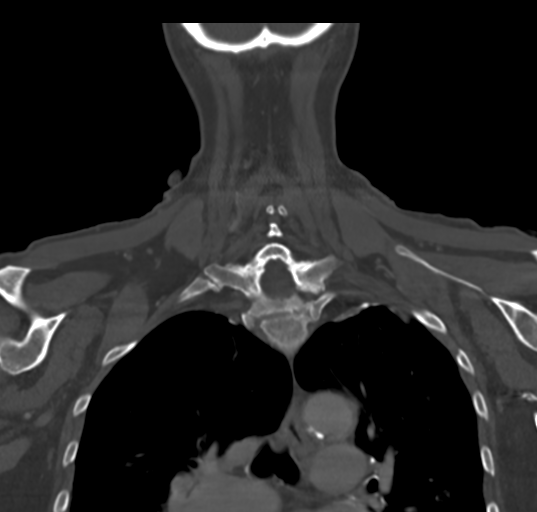

[Series 6: sagittal neck neck (person_name) 2.00 sag · sagittal · 0.52mm/px · 5 of 139 slices shown, 6 images]
[im 47/139  bone]
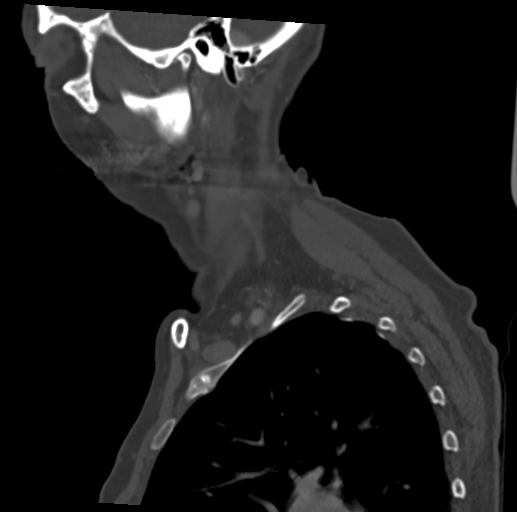
[im 58/139  bone]
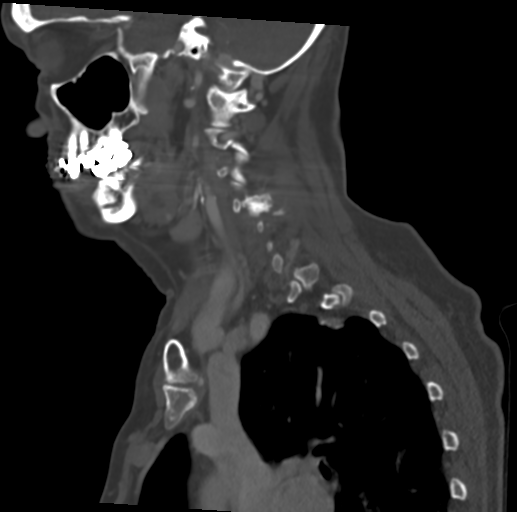
[im 70/139  soft-tissue]
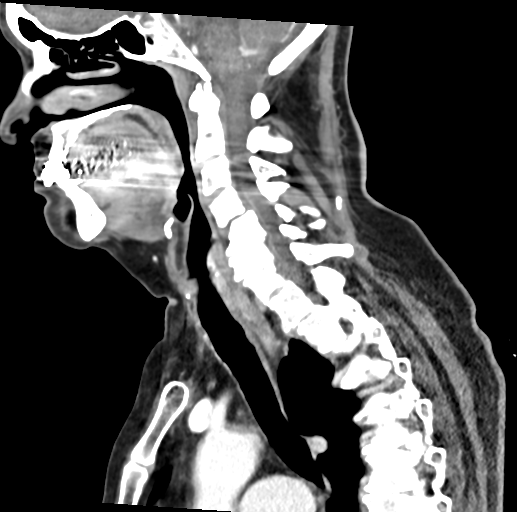
[im 70/139  bone]
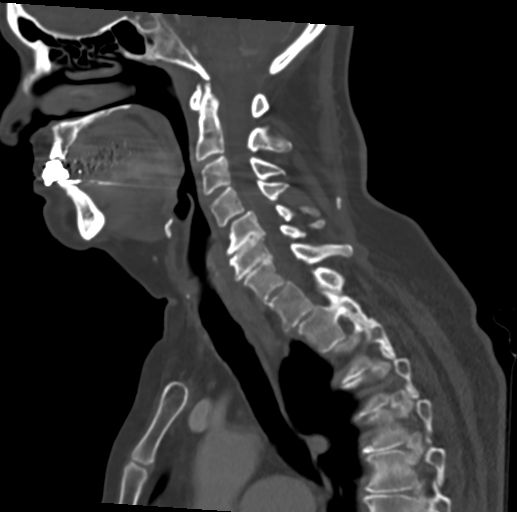
[im 81/139  bone]
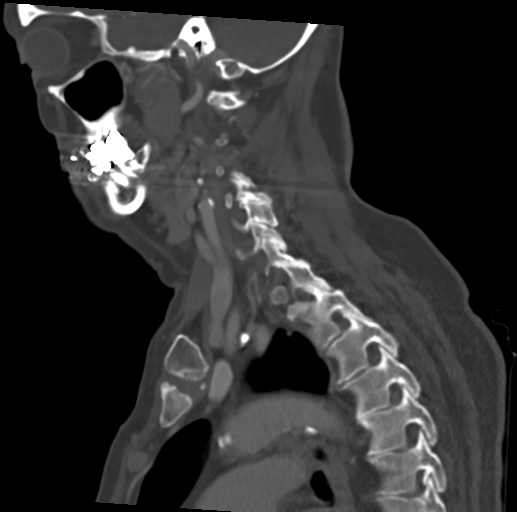
[im 93/139  bone]
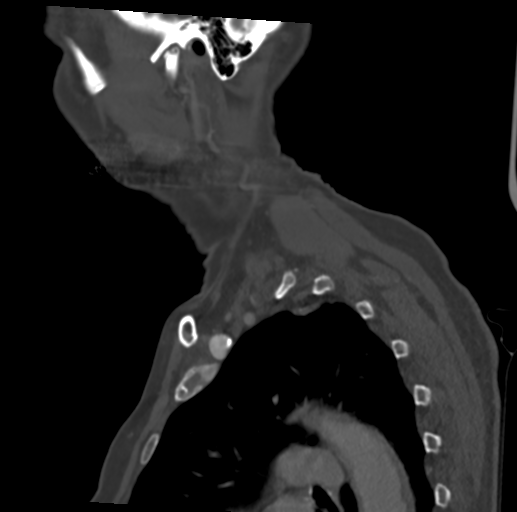

[Series 8: ax oropharynx neck neck (person_name) 2.00 ax · axial · 0.52mm/px · z∈[-759,-627]mm · 3 of 132 slices shown, 4 images]
[im 33/132  soft-tissue]
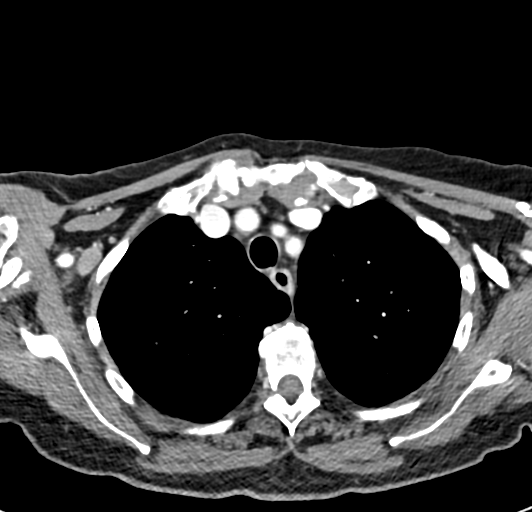
[im 33/132  bone]
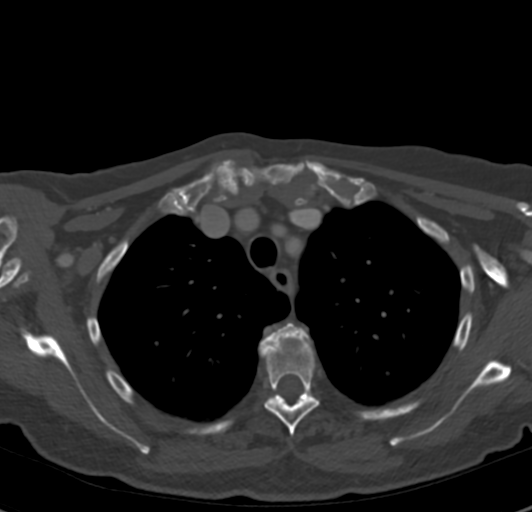
[im 66/132  bone]
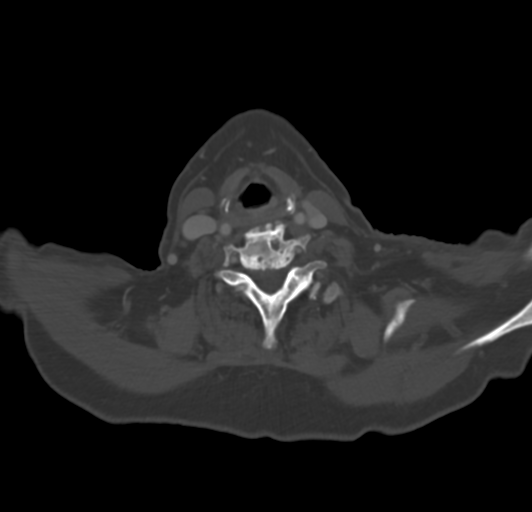
[im 99/132  bone]
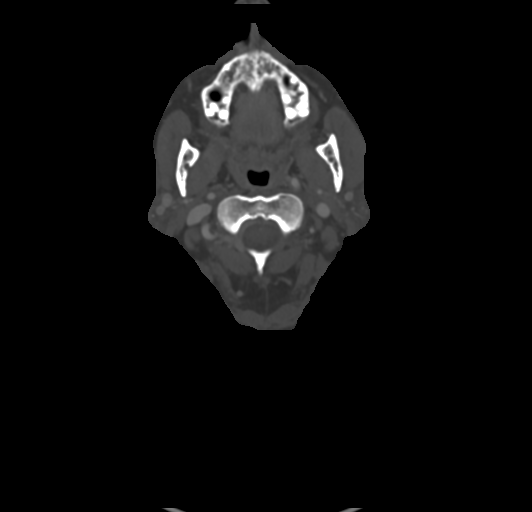

[14 of 33 positions shown; findings below may reference images not displayed]

FINDINGS: Pharynx and larynx: Streak artifact from dental restoration
partially obscures the oral cavity. No appreciable swelling or
discrete mass within the oral cavity, pharynx or larynx.

Salivary glands: No inflammation, mass, or stone.

Thyroid: The gland is small but otherwise unremarkable.

Lymph nodes: No pathologically enlarged cervical chain lymph nodes.

Vascular: The major vascular structures of the neck are patent.
Atherosclerotic plaque within the visualized aortic arch, proximal
major branch vessels of the neck, as well as carotid and vertebral
arteries. Atherosclerotic plaque within the proximal right ICA
likely results in a hemodynamically significant stenosis.

Limited intracranial: No evidence of acute intracranial abnormality
within the field of view.

Visualized orbits: No mass or acute finding.

Mastoids and visualized paranasal sinuses: No significant paranasal
sinus disease or mastoid effusion at the imaged levels.

Skeleton: Reversal of the expected cervical lordosis. C4-C5 grade 1
anterolisthesis. Cervical spondylosis most notably as follows.

At C5-C6, there is severe disc degeneration with a posterior disc
osteophyte complex, uncovertebral hypertrophy and facet arthrosis.
Bilateral bony neural foraminal narrowing and suspected at least
moderate spinal canal stenosis at this level.

At C6-C7, there is severe disc degeneration with a posterior disc
osteophyte complex, uncovertebral hypertrophy and facet arthrosis.
Bilateral bony neural foraminal narrowing and suspected moderate
spinal canal stenosis at this level. Possible facet joint ankylosis
on the right at C4-C5.

Upper chest: No consolidation within the imaged lung apices.
Centrilobular and paraseptal emphysema. Scattered foci of pleural
calcification within the imaged lung apices.

Other: A 4 mm soft tissue focus is present deep to the right-sided
skin surface marker within the posterior right upper neck, likely
reflecting a tiny nonenlarged lymph node (series 2, image 41).
IMPRESSION: No appreciable neck mass or cervical lymphadenopathy.

A 4 mm soft tissue focus is present deep to the skin surface marker
along the posterior right upper neck, likely reflecting a tiny
non-enlarged lymph node.

Cervical spondylosis as described and greatest at C5-C6 and C6-C7.
Given these findings and the provided history of neck pain, consider
a cervical spine MRI for further evaluation.

Additionally, there is reversal of the expected cervical lordosis
and C4-C5 grade 1 anterolisthesis.

Atherosclerotic plaque within the proximal right internal carotid
artery likely results in a hemodynamically significant stenosis. A
carotid artery duplex is recommended for further evaluation.

Aortic Atherosclerosis ([id number]).

## 2022-05-04 ENCOUNTER — Other Ambulatory Visit: Payer: Self-pay

## 2022-05-04 ENCOUNTER — Encounter: Payer: Self-pay | Admitting: Emergency Medicine

## 2022-05-04 ENCOUNTER — Emergency Department
Admission: EM | Admit: 2022-05-04 | Discharge: 2022-05-04 | Disposition: A | Discharge status: Home / Self Care | Payer: Medicare Other | Attending: Emergency Medicine | Admitting: Emergency Medicine

## 2022-05-04 DIAGNOSIS — I10 Essential (primary) hypertension: Secondary | ICD-10-CM | POA: Insufficient documentation

## 2022-05-04 DIAGNOSIS — R197 Diarrhea, unspecified: Secondary | ICD-10-CM | POA: Insufficient documentation

## 2022-05-04 DIAGNOSIS — N179 Acute kidney failure, unspecified: Secondary | ICD-10-CM | POA: Insufficient documentation

## 2022-05-04 DIAGNOSIS — E039 Hypothyroidism, unspecified: Secondary | ICD-10-CM | POA: Insufficient documentation

## 2022-05-04 DIAGNOSIS — R42 Dizziness and giddiness: Secondary | ICD-10-CM | POA: Insufficient documentation

## 2022-05-04 LAB — CBC
HCT: 43 % (ref 36.0–46.0)
Hemoglobin: 13.9 g/dL (ref 12.0–15.0)
MCH: 30.7 pg (ref 26.0–34.0)
MCHC: 32.3 g/dL (ref 30.0–36.0)
MCV: 94.9 fL (ref 80.0–100.0)
Platelets: 211 10*3/uL (ref 150–400)
RBC: 4.53 MIL/uL (ref 3.87–5.11)
RDW: 12.2 % (ref 11.5–15.5)
WBC: 10.1 10*3/uL (ref 4.0–10.5)
nRBC: 0 % (ref 0.0–0.2)

## 2022-05-04 LAB — BASIC METABOLIC PANEL
Anion gap: 8 (ref 5–15)
BUN: 12 mg/dL (ref 8–23)
CO2: 22 mmol/L (ref 22–32)
Calcium: 9.2 mg/dL (ref 8.9–10.3)
Chloride: 105 mmol/L (ref 98–111)
Creatinine, Ser: 1.23 mg/dL — ABNORMAL HIGH (ref 0.44–1.00)
GFR, Estimated: 43 mL/min — ABNORMAL LOW (ref >60)
Glucose, Bld: 109 mg/dL — ABNORMAL HIGH (ref 70–99)
Potassium: 5 mmol/L (ref 3.5–5.1)
Sodium: 135 mmol/L (ref 135–145)

## 2022-05-04 LAB — TROPONIN I (HIGH SENSITIVITY): Troponin I (High Sensitivity): 9 ng/L (ref <18)

## 2022-05-04 MED ORDER — SODIUM CHLORIDE 0.9 % IV BOLUS: 1000.0000 mL | Freq: Once | INTRAVENOUS | Status: DC

## 2022-05-04 MED ORDER — SODIUM CHLORIDE 0.9 % IV BOLUS
1000.0000 mL | Freq: Once | INTRAVENOUS | Status: AC
  Administered 2022-05-04: 1000 mL via INTRAVENOUS

## 2022-05-04 MED ORDER — ONDANSETRON HCL 4 MG/2ML IJ SOLN
4.0000 mg | Freq: Once | INTRAMUSCULAR | Status: AC
  Administered 2022-05-04: 4 mg via INTRAVENOUS
  Filled 2022-05-04: qty 2

## 2022-05-04 NOTE — ED Provider Notes (Signed)
[facility] Provider Note    Event Date/Time   First MD Initiated Contact with Patient 05/04/22 1218     (approximate)   History   Dizziness   HPI  Katherinne Mofield is a 88 y.o. female with past medical history of hypertension who presents with diarrhea and lightheadedness.  Since Monday for the past 3 days she has had multiple episodes of loose stool that is nonbloody.  About 4-5 episodes per day with some fecal incontinence.  She has had decreased appetite having several cups of water and toast per day.  Denies significant abdominal pain no fevers or chills has had some mild nasal congestion has had some nausea but no vomiting.  No recent antibiotics or travel out of the country.  Today has felt lightheaded upon standing which improves with sitting denies chest pain dyspnea palpitations.    Past Medical History:  Diagnosis Date   Hypertension     Patient Active Problem List   Diagnosis Date Noted   Bilateral carotid artery stenosis 06/16/2021   Carotid stenosis 03/22/2021   Gastroesophageal reflux disease 03/11/2019   Major depressive disorder, recurrent, in remission ([place]) 03/11/2019   Pure hypercholesterolemia 03/11/2019   Acquired hypothyroidism 04/16/2018   Trimalleolar fracture 04/16/2018   Trimalleolar fracture of ankle, closed, right, initial encounter 04/16/2018   Senile osteopenia 11/07/2016   Depressive disorder 03/17/2016   Neuropathy 03/17/2016   Hypertensive disorder 09/23/2015   Cubital tunnel syndrome on right 08/24/2014   Ganglion, joint 12/21/2009   Primary localized osteoarthrosis of hand 12/21/2009   Neoplasm of unspecified nature of bone, soft tissue, and skin 12/09/2009     Physical Exam  Triage Vital Signs: ED Triage Vitals  Enc Vitals Group     BP 05/04/22 1157 114/68     Pulse Rate 05/04/22 1157 65     Resp 05/04/22 1157 18     Temp 05/04/22 1157 98.5 F (36.9 C)     Temp Source 05/04/22 1157 Oral     SpO2  05/04/22 1157 95 %     Weight 05/04/22 1155 132 lb (59.9 kg)     Height 05/04/22 1155 5' 6.5" (1.689 m)     Head Circumference --      Peak Flow --      Pain Score 05/04/22 1155 5     Pain Loc --      Pain Edu? --      Excl. in [place]? --     Most recent vital signs: Vitals:   05/04/22 1500 05/04/22 1512  BP: (!) 154/65 (!) 148/68  Pulse: 74 70  Resp:  18  Temp:    SpO2:  95%     General: Awake, no distress.  CV:  Good peripheral perfusion.  Resp:  Normal effort.  Abd:  No distention.  Very mild tenderness to palpation in the right lower quadrant with no guarding abdomen is soft Neuro:             Awake, Alert, Oriented x 3  Other:     ED Results / Procedures / Treatments  Labs (all labs ordered are listed, but only abnormal results are displayed) Labs Reviewed  BASIC METABOLIC PANEL - Abnormal; Notable for the following components:      Result Value   Glucose, Bld 109 (*)    Creatinine, Ser 1.23 (*)    GFR, Estimated 43 (*)    All other components within normal limits  GASTROINTESTINAL PANEL BY PCR, STOOL (REPLACES  STOOL CULTURE)  C DIFFICILE QUICK SCREEN W PCR REFLEX    CBC  URINALYSIS, ROUTINE W REFLEX MICROSCOPIC  CBG MONITORING, ED  TROPONIN I (HIGH SENSITIVITY)     EKG  EKG reviewed and interpreted by myself shows normal sinus rhythm with normal axis, nonspecific ST pression in the inferior leads   RADIOLOGY    PROCEDURES:  Critical Care performed: No  Procedures   MEDICATIONS ORDERED IN ED: Medications  sodium chloride 0.9 % bolus 1,000 mL (has no administration in time range)  sodium chloride 0.9 % bolus 1,000 mL (0 mLs Intravenous Stopped 05/04/22 1437)  ondansetron (ZOFRAN) injection 4 mg (4 mg Intravenous Given 05/04/22 1350)     IMPRESSION / MDM / ASSESSMENT AND PLAN / ED COURSE  I reviewed the triage vital signs and the nursing notes.                              Patient's presentation is most consistent with acute presentation with  potential threat to life or bodily function.  Differential diagnosis includes, but is not limited to, viral diarrheal illness, bacterial diarrheal illness, hypovolemia, orthostatic hypotension, vasovagal presyncope  Patient is an 88 year old female presents with several days of diarrhea and lightheadedness upon standing.  Having about 4-5 episodes of loose stool per day that is nonbloody she has minimal associated abdominal pain no fevers but has had decreased appetite and intake.  She feels lightheaded with standing but denies chest pain palpitations or dyspnea.  Her vitals are reassuring she overall appears well nontoxic and looks quite well for her age.  Her abdomen just has minimal tenderness in the right lower quadrant but overall is benign exam.  She does have a mild AKI with creatinine 1.23 from baseline of around 0.9 potassium 5.  CBC without leukocytosis or anemia.  Her EKG which is obtained from triage has some subtle ST depression in inferior leads unfortunately have no prior to compare to.  Low suspicion for ACS given no chest pain dyspnea and her presyncope sounds very much orthostatic and is fitting in the setting of her diarrhea.  Given these changes with no prior to compare to will check troponin.  We will fluid resuscitate with a liter of fluid IV Zofran.  I have ordered a stool PCR and C. difficile if patient is able to provide stool sample.  Troponin is negative.  After liter fluid patient stood up walked to the bathroom and felt much better.  She is urinating.  Was unfortunately not able to provide a stool sample.  I offered her a liter of fluid in addition but she preferred discharge.  We discussed return precautions for fever abdominal pain bloody stool or syncope.  Given she is feeling improved with otherwise reassuring work-up here I think that she is appropriate for discharge.     FINAL CLINICAL IMPRESSION(S) / ED DIAGNOSES   Final diagnoses:  Diarrhea, unspecified type      Rx / DC Orders   ED Discharge Orders     None        Note:  This document was prepared using Dragon voice recognition software and may include unintentional dictation errors.   Rada Hay, MD 05/04/22 [phone]

## 2022-05-04 NOTE — Discharge Instructions (Signed)
Please try to stay hydrated by drinking fluids with electrolytes in it.  Return to the emergency department for fever abdominal pain blood in your stool or if your symptoms are just not getting better or you are passing out.

## 2022-05-04 NOTE — ED Triage Notes (Addendum)
Pt via EMS from [facility], pt c/o dizziness, diarrhea, and L sided headache started Monday. States the dizziness is when she is switching positions.  States the headache started this morning and states she does have a hx of HTN but has been taking medicine as prescribed. Denies any recent head injury. Pt is A&Ox4 and NAD.

## 2022-05-04 NOTE — ED Notes (Signed)
See triage note   Presents with some dizziness and h/a   States she has not felt good for about 3 days  Had near syncopal episode this am

## 2022-08-01 ENCOUNTER — Other Ambulatory Visit (INDEPENDENT_AMBULATORY_CARE_PROVIDER_SITE_OTHER): Payer: Self-pay | Admitting: Nurse Practitioner

## 2022-08-01 DIAGNOSIS — I6523 Occlusion and stenosis of bilateral carotid arteries: Secondary | ICD-10-CM

## 2022-08-02 ENCOUNTER — Encounter (INDEPENDENT_AMBULATORY_CARE_PROVIDER_SITE_OTHER): Payer: Self-pay | Admitting: Nurse Practitioner

## 2022-08-02 ENCOUNTER — Ambulatory Visit (INDEPENDENT_AMBULATORY_CARE_PROVIDER_SITE_OTHER): Payer: Medicare Other

## 2022-08-02 ENCOUNTER — Ambulatory Visit (INDEPENDENT_AMBULATORY_CARE_PROVIDER_SITE_OTHER): Payer: Medicare Other | Admitting: Nurse Practitioner

## 2022-08-02 VITALS — BP 154/70 | HR 64 | Resp 17 | Ht 66.5 in | Wt 129.6 lb

## 2022-08-02 DIAGNOSIS — I10 Essential (primary) hypertension: Secondary | ICD-10-CM

## 2022-08-02 DIAGNOSIS — E7800 Pure hypercholesterolemia, unspecified: Secondary | ICD-10-CM

## 2022-08-02 DIAGNOSIS — I6523 Occlusion and stenosis of bilateral carotid arteries: Secondary | ICD-10-CM

## 2022-08-07 ENCOUNTER — Encounter (INDEPENDENT_AMBULATORY_CARE_PROVIDER_SITE_OTHER): Payer: Self-pay | Admitting: Nurse Practitioner

## 2022-08-07 NOTE — Progress Notes (Signed)
Subjective:    Patient ID: Tanya Larson, female    DOB: 28-Mar-1933, 88 y.o.   MRN: [id number] No chief complaint on file.   The patient is seen for follow up evaluation of carotid stenosis. The carotid stenosis followed by ultrasound.   The patient denies amaurosis fugax. There is no recent history of TIA symptoms or focal motor deficits. There is no prior documented CVA.  The patient is taking enteric-coated aspirin 81 mg daily.  There is no history of migraine headaches. There is no history of seizures.  No recent shortening of the patient's walking distance or new symptoms consistent with claudication.  No history of rest pain symptoms. No new ulcers or wounds of the lower extremities have occurred.  There is no history of DVT, PE or superficial thrombophlebitis. No recent episodes of angina or shortness of breath documented.   Carotid Duplex done today shows 60 to 79% stenosis of the right ICA with 1 to 39% stenosis of the left ICA.  Velocities slightly increased in the right ICA from previous study 6 months ago.  Bilateral vertebral arteries have antegrade flow with normal flow hemodynamics in the bilateral subclavian arteries.    Review of Systems  All other systems reviewed and are negative.      Objective:   Physical Exam Vitals reviewed.  HENT:     Head: Normocephalic.  Neck:     Vascular: No carotid bruit.  Cardiovascular:     Rate and Rhythm: Normal rate.     Pulses: Normal pulses.  Pulmonary:     Effort: Pulmonary effort is normal.  Skin:    General: Skin is warm and dry.  Neurological:     Mental Status: She is alert and oriented to person, place, and time.  Psychiatric:        Mood and Affect: Mood normal.        Behavior: Behavior normal.        Thought Content: Thought content normal.        Judgment: Judgment normal.     BP (!) 154/70 (BP Location: Right Arm)   Pulse 64   Resp 17   Ht 5' 6.5" (1.689 m)   Wt 129 lb 9.6 oz (58.8 kg)   BMI  20.60 kg/m   Past Medical History:  Diagnosis Date   Hypertension     Social History   Socioeconomic History   Marital status: Unknown    Spouse name: Not on file   Number of children: Not on file   Years of education: Not on file   Highest education level: Not on file  Occupational History   Not on file  Tobacco Use   Smoking status: Former   Smokeless tobacco: Never  Substance and Sexual Activity   Alcohol use: Yes    Comment: social drinker   Drug use: Never   Sexual activity: Not on file  Other Topics Concern   Not on file  Social History Narrative   Not on file   Social Determinants of Health   Financial Resource Strain: Not on file  Food Insecurity: Not on file  Transportation Needs: Not on file  Physical Activity: Not on file  Stress: Not on file  Social Connections: Not on file  Intimate Partner Violence: Not on file    Past Surgical History:  Procedure Laterality Date   APPENDECTOMY     arm surgery Right    EYE SURGERY      Family History  Problem  Relation Age of Onset   Aortic stenosis Mother    Congestive Heart Failure Brother    Suicidality Brother     Allergies  Allergen Reactions   Erythromycin Hives    Mouth breaks out       Latest Ref Rng & Units 05/04/2022   12:02 PM  CBC  WBC 4.0 - 10.5 K/uL 10.1   Hemoglobin 12.0 - 15.0 g/dL 13.9   Hematocrit 36.0 - 46.0 % 43.0   Platelets 150 - 400 K/uL 211       CMP     Component Value Date/Time   NA 135 05/04/2022 1202   K 5.0 05/04/2022 1202   CL 105 05/04/2022 1202   CO2 22 05/04/2022 1202   GLUCOSE 109 (H) 05/04/2022 1202   BUN 12 05/04/2022 1202   CREATININE 1.23 (H) 05/04/2022 1202   CALCIUM 9.2 05/04/2022 1202   GFRNONAA 43 (L) 05/04/2022 1202     No results found.     Assessment & Plan:   1. Bilateral carotid artery stenosis Recommend:  Given the patient's asymptomatic subcritical stenosis no further invasive testing or surgery at this time.  Duplex  ultrasound shows 60 to 79% stenosis of the right ICA with 1 to 39% stenosis of the left ICA.  This is consistent with previous study 6 months ago   Continue antiplatelet therapy as prescribed Continue management of CAD, HTN and Hyperlipidemia Healthy heart diet,  encouraged exercise at least 4 times per week Follow up in 6 months with duplex ultrasound and physical exam   - VAS US CAROTID  2. Pure hypercholesterolemia Continue statin as ordered and reviewed, no changes at this time   3. Primary hypertension Continue antihypertensive medications as already ordered, these medications have been reviewed and there are no changes at this time.    Current Outpatient Medications on File Prior to Visit  Medication Sig Dispense Refill   ALPRAZolam (XANAX) 0.5 MG tablet Take by mouth.     amLODipine (NORVASC) 2.5 MG tablet Take 2.5 mg by mouth daily.     clopidogrel (PLAVIX) 75 MG tablet Take 1 tablet (75 mg total) by mouth daily. 90 tablet 3   escitalopram (LEXAPRO) 10 MG tablet Take 1 tablet by mouth daily.     gabapentin (NEURONTIN) 300 MG capsule Take 300 mg by mouth daily.     levothyroxine (SYNTHROID) 100 MCG tablet Take 100 mcg by mouth daily.     metoprolol tartrate (LOPRESSOR) 50 MG tablet Take 50 mg by mouth daily.     pantoprazole (PROTONIX) 40 MG tablet Take 0.5 tablets by mouth daily.     pravastatin (PRAVACHOL) 20 MG tablet      valsartan (DIOVAN) 40 MG tablet      No current facility-administered medications on file prior to visit.    There are no Patient Instructions on file for this visit. No follow-ups on file.   Kris Hartmann, NP

## 2022-08-15 ENCOUNTER — Ambulatory Visit: Payer: Medicare Other | Admitting: Nurse Practitioner

## 2022-08-15 ENCOUNTER — Encounter: Payer: Self-pay | Admitting: Nurse Practitioner

## 2022-08-15 VITALS — BP 150/88 | HR 59 | Temp 97.4°F | Ht 66.5 in | Wt 128.5 lb

## 2022-08-15 DIAGNOSIS — I6523 Occlusion and stenosis of bilateral carotid arteries: Secondary | ICD-10-CM

## 2022-08-15 DIAGNOSIS — I700 Atherosclerosis of aorta: Secondary | ICD-10-CM

## 2022-08-15 DIAGNOSIS — E039 Hypothyroidism, unspecified: Secondary | ICD-10-CM | POA: Diagnosis not present

## 2022-08-15 DIAGNOSIS — K219 Gastro-esophageal reflux disease without esophagitis: Secondary | ICD-10-CM

## 2022-08-15 DIAGNOSIS — E782 Mixed hyperlipidemia: Secondary | ICD-10-CM

## 2022-08-15 DIAGNOSIS — F339 Major depressive disorder, recurrent, unspecified: Secondary | ICD-10-CM

## 2022-08-15 DIAGNOSIS — N1831 Chronic kidney disease, stage 3a: Secondary | ICD-10-CM | POA: Diagnosis not present

## 2022-08-15 DIAGNOSIS — M8580 Other specified disorders of bone density and structure, unspecified site: Secondary | ICD-10-CM

## 2022-08-15 DIAGNOSIS — I10 Essential (primary) hypertension: Secondary | ICD-10-CM

## 2022-08-15 NOTE — Progress Notes (Signed)
Careteam: Patient Care Team: Lauree Chandler, NP as PCP - General (Geriatric Medicine)  Advanced Directive information Does Patient Have a Medical Advance Directive?: Yes, Type of Advance Directive: [place];Living will, Does patient want to make changes to medical advance directive?: No - Patient declined  Allergies  Allergen Reactions   Erythromycin Hives    Mouth breaks out    Chief Complaint  Patient presents with   Establish Care    New to establish care at [facility] clinic. Pill bottles not present at initial appointment. Patient is concerned about stage 3 kidney disease. Insurance in epic verified. Patient stopped by the clinic and we verified all medications according to pill bottles. Diovan was listed as twice daily, however Janett Billow told patient to only take once daily at appointment.      HPI: Patient is a 88 y.o. female seen in today at the [facility] to establish.   Hypertension- elevated on the last few times. Generally 130s/70s  Her blood pressure machine broke and she has not taken at home. On amlodipine 2.5 mg and metoprolol 50 mg by mouth daily Reports she is taking volsartan twice daily but listed as daily  Depression/Anxiety- not controlled on lexapro 10 mg by mouth daily. Uses xanax as needed- sometimes three times a week and then sometimes she will not take for a month.  She moved with her husband a lot but lived at [employer] and had a hard time moving here. Has been here at [facility] for 5 years. She has not gotten into a church or a group here. Close to her children.   Carotid artery stenosis- on plavix and pravstatin 20 mg daily   Hypothyroid - on synthroid  GERD- not having symptoms at this time. She cut Protonix in half but questions if she needs it at all.   OA- occasional aches, will use half tylenol and that helps   Osteopenia- not on cal or vit d   Review of Systems:  Review of Systems  Constitutional:   Negative for chills, fever and weight loss.  HENT:  Negative for tinnitus.   Respiratory:  Negative for cough, sputum production and shortness of breath.   Cardiovascular:  Negative for chest pain, palpitations and leg swelling.  Gastrointestinal:  Negative for abdominal pain, constipation, diarrhea and heartburn.  Genitourinary:  Negative for dysuria, frequency and urgency.  Musculoskeletal:  Negative for back pain, falls, joint pain and myalgias.  Skin: Negative.   Neurological:  Negative for dizziness and headaches.  Psychiatric/Behavioral:  Negative for depression and memory loss. The patient does not have insomnia.     Past Medical History:  Diagnosis Date   Aortic atherosclerosis [facility])    Per New Patient Packet   Aortic stenosis, moderate    Per New Patient Packet   Arthritis    Per New Patient Packet   Bilateral carotid artery stenosis    Per New Patient Packet   Depressive disorder    Per New Patient Packet   GERD (gastroesophageal reflux disease)    Per New Patient Packet   H/O bone density study    Prior to 2019, Per New Patient Packet   History of colonoscopy    Prior to 2019, Per New Patient Packet   History of mammogram    Prior to 2019, Per New Patient Packet   History of Papanicolaou smear of cervix    Prior to 2019, Per New Patient Packet   Hypertension  Hypothyroidism    Per New Patient Packet   Senile osteopenia    Per New Patient Packet   Stage 3 chronic kidney disease [facility])    Per New Patient Packet   Past Surgical History:  Procedure Laterality Date   ANKLE SURGERY Right 2019   APPENDECTOMY     arm surgery Right    EYE SURGERY     Social History:   reports that she quit smoking about 40 years ago. Her smoking use included cigarettes. She has never used smokeless tobacco. She reports current alcohol use. She reports that she does not use drugs.  Family History  Problem Relation Age of Onset   Aortic stenosis Mother    Dementia Father     Congestive Heart Failure Brother    Kidney disease Brother    Suicidality Brother     Medications: Patient's Medications  New Prescriptions   No medications on file  Previous Medications   ALPRAZOLAM (XANAX) 0.5 MG TABLET    Take 0.25 mg by mouth as needed.   AMLODIPINE (NORVASC) 2.5 MG TABLET    Take 2.5 mg by mouth daily.   BIOTIN 5000 MCG CAPS    Take 2 capsules by mouth daily.   CLOPIDOGREL (PLAVIX) 75 MG TABLET    Take 1 tablet (75 mg total) by mouth daily.   ESCITALOPRAM (LEXAPRO) 10 MG TABLET    Take 1 tablet by mouth daily.   LEVOTHYROXINE (SYNTHROID) 100 MCG TABLET    Take 100 mcg by mouth daily.   METOPROLOL TARTRATE (LOPRESSOR) 50 MG TABLET    Take 50 mg by mouth daily.   MULTIPLE VITAMINS-MINERALS (PRESERVISION AREDS 2) CAPS    Take 2 capsules by mouth daily at 12 noon.   PRAVASTATIN (PRAVACHOL) 40 MG TABLET    Take 40 mg by mouth daily.   VALSARTAN (DIOVAN) 40 MG TABLET    Take 40 mg by mouth daily.  Modified Medications   No medications on file  Discontinued Medications   GABAPENTIN (NEURONTIN) 300 MG CAPSULE    Take 300 mg by mouth daily.   PANTOPRAZOLE (PROTONIX) 40 MG TABLET    Take 0.5 tablets by mouth daily.   PRAVASTATIN (PRAVACHOL) 20 MG TABLET    Take 20 mg by mouth daily.    Physical Exam:  Vitals:   08/15/22 0754  BP: (!) 150/88  Pulse: (!) 59  Temp: (!) 97.4 F (36.3 C)  TempSrc: Temporal  SpO2: 97%  Weight: 128 lb 8 oz (58.3 kg)  Height: 5' 6.5" (1.689 m)   Body mass index is 20.43 kg/m. Wt Readings from Last 3 Encounters:  08/15/22 128 lb 8 oz (58.3 kg)  08/02/22 129 lb 9.6 oz (58.8 kg)  05/04/22 132 lb (59.9 kg)    Physical Exam Constitutional:      General: She is not in acute distress.    Appearance: She is well-developed. She is not diaphoretic.  HENT:     Head: Normocephalic and atraumatic.     Mouth/Throat:     Pharynx: No oropharyngeal exudate.  Eyes:     Conjunctiva/sclera: Conjunctivae normal.     Pupils: Pupils are equal,  round, and reactive to light.  Cardiovascular:     Rate and Rhythm: Normal rate and regular rhythm.     Heart sounds: Normal heart sounds.  Pulmonary:     Effort: Pulmonary effort is normal.     Breath sounds: Normal breath sounds.  Abdominal:     General: Bowel sounds are  normal.     Palpations: Abdomen is soft.  Musculoskeletal:     Cervical back: Normal range of motion and neck supple.     Right lower leg: No edema.     Left lower leg: No edema.  Skin:    General: Skin is warm and dry.  Neurological:     Mental Status: She is alert.  Psychiatric:        Mood and Affect: Mood normal.     Labs reviewed: Basic Metabolic Panel: Recent Labs    05/04/22 1202  NA 135  K 5.0  CL 105  CO2 22  GLUCOSE 109*  BUN 12  CREATININE 1.23*  CALCIUM 9.2   Liver Function Tests: No results for input(s): "AST", "ALT", "ALKPHOS", "BILITOT", "PROT", "ALBUMIN" in the last 8760 hours. No results for input(s): "LIPASE", "AMYLASE" in the last 8760 hours. No results for input(s): "AMMONIA" in the last 8760 hours. CBC: Recent Labs    05/04/22 1202  WBC 10.1  HGB 13.9  HCT 43.0  MCV 94.9  PLT 211   Lipid Panel: No results for input(s): "CHOL", "HDL", "LDLCALC", "TRIG", "CHOLHDL", "LDLDIRECT" in the last 8760 hours. TSH: No results for input(s): "TSH" in the last 8760 hours. A1C: No results found for: "HGBA1C"   Assessment/Plan 1. Bilateral carotid artery stenosis -continues to follow up with vascular, continues on plavix  2. Acquired hypothyroidism -continues on synthroid  3. Mixed hyperlipidemia -continues on pravastatin   4. Stage 3a chronic kidney disease (HCC) -Chronic and stable Encourage proper hydration Follow metabolic panel Avoid nephrotoxic meds (NSAIDS)  5. Gastroesophageal reflux disease without esophagitis -without symptoms at this time, plans to stop protonix and monitor.   6. Senile osteopenia -Recommended to take calcium 600 mg twice daily with  Vitamin D 2000 units daily and weight bearing activity 30 mins/5 days a week  7. Episode of recurrent major depressive disorder, unspecified depression episode severity ([place]) Continues on lexapro  8. Aortic atherosclerosis ([place]) Continues on statin and plavix.   9. Hypertension -elevated today, however she reports BP is well controlled -will have her take BP at home and send my chart msg to review.  -continue current regimen at this time.  Can increase norvasc 5 mg daily if needed   Desere Gwin K. Olla, [facility] [phone]

## 2022-08-15 NOTE — Patient Instructions (Addendum)
Recommended to take calcium 600 mg twice daily with Vitamin D 2000 units daily and weight bearing activity 30 mins/5 days a week  Make sure you only take valsartan 40 mg by mouth daily  STOP protonix (but if indigestion worsens let me know and we can call in 20 mg tablets)   To take blood pressure daily over the next week- ~1 hour after you have taken your medication and send mychart msg for review.

## 2022-08-21 ENCOUNTER — Encounter (INDEPENDENT_AMBULATORY_CARE_PROVIDER_SITE_OTHER): Payer: Self-pay

## 2022-09-20 ENCOUNTER — Other Ambulatory Visit (INDEPENDENT_AMBULATORY_CARE_PROVIDER_SITE_OTHER): Payer: Self-pay | Admitting: Nurse Practitioner

## 2022-09-21 ENCOUNTER — Encounter: Payer: Self-pay | Admitting: Nurse Practitioner

## 2022-09-21 ENCOUNTER — Ambulatory Visit: Payer: Medicare Other | Admitting: Nurse Practitioner

## 2022-09-21 VITALS — BP 120/70 | HR 60 | Temp 98.6°F | Resp 20 | Wt 125.5 lb

## 2022-09-21 DIAGNOSIS — K219 Gastro-esophageal reflux disease without esophagitis: Secondary | ICD-10-CM

## 2022-09-21 DIAGNOSIS — E782 Mixed hyperlipidemia: Secondary | ICD-10-CM

## 2022-09-21 DIAGNOSIS — I10 Essential (primary) hypertension: Secondary | ICD-10-CM

## 2022-09-21 DIAGNOSIS — E039 Hypothyroidism, unspecified: Secondary | ICD-10-CM | POA: Diagnosis not present

## 2022-09-21 DIAGNOSIS — I6523 Occlusion and stenosis of bilateral carotid arteries: Secondary | ICD-10-CM | POA: Diagnosis not present

## 2022-09-21 DIAGNOSIS — N1831 Chronic kidney disease, stage 3a: Secondary | ICD-10-CM

## 2022-09-21 DIAGNOSIS — I350 Nonrheumatic aortic (valve) stenosis: Secondary | ICD-10-CM

## 2022-09-21 NOTE — Progress Notes (Deleted)
Patient called at 8:51am, 9:03am, 9:10am

## 2022-09-21 NOTE — Progress Notes (Signed)
Careteam: Patient Care Team: Lauree Chandler, NP as PCP - General (Geriatric Medicine)  Advanced Directive information Does Patient Have a Medical Advance Directive?: Yes, Type of Advance Directive: [place];Living will;Out of facility DNR (pink MOST or yellow form), Does patient want to make changes to medical advance directive?: No - Patient declined  Allergies  Allergen Reactions   Erythromycin Hives    Mouth breaks out    Chief Complaint  Patient presents with   Medical Management of Chronic Issues    1 month follow up.   Health Maintenance    Discuss the need for AWV, and Dexa scan.   Immunizations    Discuss the need for Shingrix vaccine,and Covid Booster.     HPI: Patient is a 88 y.o. female seen in today at the [facility] for follow up blood pressure.   150/70 on home blood pressure cuff once- this morning 118/64- she is now taking valsartan 40 mg daily with norvasc 2.5 mg daily. Did not bring log today.   She stopped protonix- occasionally has GERD and will take half tablet  Had diarrhea all summer but in the last week has improved.   Has murmur- diagnosised 4 years ago.   Review of Systems:  Review of Systems  Constitutional:  Negative for chills, fever and weight loss.  HENT:  Negative for tinnitus.   Respiratory:  Negative for cough, sputum production and shortness of breath.   Cardiovascular:  Negative for chest pain, palpitations and leg swelling.  Gastrointestinal:  Negative for abdominal pain, constipation, diarrhea and heartburn.  Genitourinary:  Negative for dysuria, frequency and urgency.  Musculoskeletal:  Negative for back pain, falls, joint pain and myalgias.  Skin: Negative.   Neurological:  Negative for dizziness and headaches.  Psychiatric/Behavioral:  Negative for depression and memory loss. The patient does not have insomnia.     Past Medical History:  Diagnosis Date   Aortic atherosclerosis [facility])    Per  New Patient Packet   Aortic stenosis, moderate    Per New Patient Packet   Arthritis    Per New Patient Packet   Bilateral carotid artery stenosis    Per New Patient Packet   Depressive disorder    Per New Patient Packet   GERD (gastroesophageal reflux disease)    Per New Patient Packet   H/O bone density study    Prior to 2019, Per New Patient Packet   History of colonoscopy    Prior to 2019, Per New Patient Packet   History of mammogram    Prior to 2019, Per New Patient Packet   History of Papanicolaou smear of cervix    Prior to 2019, Per New Patient Packet   Hypertension    Hypothyroidism    Per New Patient Packet   Senile osteopenia    Per New Patient Packet   Stage 3 chronic kidney disease [facility])    Per New Patient Packet   Past Surgical History:  Procedure Laterality Date   ANKLE SURGERY Right 2019   APPENDECTOMY     arm surgery Right    EYE SURGERY     Social History:   reports that she quit smoking about 40 years ago. Her smoking use included cigarettes. She has never used smokeless tobacco. She reports current alcohol use. She reports that she does not use drugs.  Family History  Problem Relation Age of Onset   Aortic stenosis Mother    Dementia Father  Congestive Heart Failure Brother    Kidney disease Brother    Suicidality Brother     Medications: Patient's Medications  New Prescriptions   No medications on file  Previous Medications   ALPRAZOLAM (XANAX) 0.5 MG TABLET    Take 0.25 mg by mouth as needed.   AMLODIPINE (NORVASC) 2.5 MG TABLET    Take 2.5 mg by mouth daily.   BIOTIN 5000 MCG CAPS    Take 2 capsules by mouth daily.   CLOPIDOGREL (PLAVIX) 75 MG TABLET    Take 1 tablet (75 mg total) by mouth daily.   ESCITALOPRAM (LEXAPRO) 10 MG TABLET    Take 1 tablet by mouth daily.   LEVOTHYROXINE (SYNTHROID) 100 MCG TABLET    Take 100 mcg by mouth daily.   METOPROLOL TARTRATE (LOPRESSOR) 50 MG TABLET    Take 50 mg by mouth daily.   MULTIPLE  VITAMINS-MINERALS (PRESERVISION AREDS 2) CAPS    Take 2 capsules by mouth daily at 12 noon.   PRAVASTATIN (PRAVACHOL) 40 MG TABLET    Take 40 mg by mouth daily.   VALSARTAN (DIOVAN) 40 MG TABLET    Take 40 mg by mouth daily.  Modified Medications   No medications on file  Discontinued Medications   No medications on file    Physical Exam:  Vitals:   09/21/22 0926  BP: 120/70  Pulse: 60  Resp: 20  Temp: 98.6 F (37 C)  SpO2: 95%   There is no height or weight on file to calculate BMI. Wt Readings from Last 3 Encounters:  08/15/22 128 lb 8 oz (58.3 kg)  08/02/22 129 lb 9.6 oz (58.8 kg)  05/04/22 132 lb (59.9 kg)    Physical Exam Constitutional:      General: She is not in acute distress.    Appearance: She is well-developed. She is not diaphoretic.  HENT:     Head: Normocephalic and atraumatic.     Mouth/Throat:     Pharynx: No oropharyngeal exudate.  Eyes:     Conjunctiva/sclera: Conjunctivae normal.     Pupils: Pupils are equal, round, and reactive to light.  Cardiovascular:     Rate and Rhythm: Normal rate and regular rhythm.     Heart sounds: Murmur heard.  Pulmonary:     Effort: Pulmonary effort is normal.     Breath sounds: Normal breath sounds.  Abdominal:     General: Bowel sounds are normal.     Palpations: Abdomen is soft.  Musculoskeletal:     Cervical back: Normal range of motion and neck supple.     Right lower leg: No edema.     Left lower leg: No edema.  Skin:    General: Skin is warm and dry.  Neurological:     Mental Status: She is alert.  Psychiatric:        Mood and Affect: Mood normal.     Labs reviewed: Basic Metabolic Panel: Recent Labs    05/04/22 1202  NA 135  K 5.0  CL 105  CO2 22  GLUCOSE 109*  BUN 12  CREATININE 1.23*  CALCIUM 9.2   Liver Function Tests: No results for input(s): "AST", "ALT", "ALKPHOS", "BILITOT", "PROT", "ALBUMIN" in the last 8760 hours. No results for input(s): "LIPASE", "AMYLASE" in the last 8760  hours. No results for input(s): "AMMONIA" in the last 8760 hours. CBC: Recent Labs    05/04/22 1202  WBC 10.1  HGB 13.9  HCT 43.0  MCV 94.9  PLT 211  Lipid Panel: No results for input(s): "CHOL", "HDL", "LDLCALC", "TRIG", "CHOLHDL", "LDLDIRECT" in the last 8760 hours. TSH: No results for input(s): "TSH" in the last 8760 hours. A1C: No results found for: "HGBA1C"   Assessment/Plan 1. Nonrheumatic aortic valve stenosis - ECHOCARDIOGRAM COMPLETE; Future  2. Acquired hypothyroidism -last TSH was borderline low- will recheck with next visit.   3. Mixed hyperlipidemia -LDL at goal in August. Continue dietary modifications.   4. Bilateral carotid artery stenosis -continues on plavix, followed by vascular  5. Stage 3a chronic kidney disease (HCC) -Chronic and stable Encourage proper hydration Follow metabolic panel Avoid nephrotoxic meds (NSAIDS)  6. Gastroesophageal reflux disease without esophagitis -stable, occasionally needs medication PRN  7. Primary hypertension -Blood pressure well controlled, goal bp <140/90 Continue current medications and dietary modifications follow metabolic panel  Next appt: 6 months.  Carlos American. Murdock, [facility] [phone]

## 2023-01-23 ENCOUNTER — Other Ambulatory Visit (INDEPENDENT_AMBULATORY_CARE_PROVIDER_SITE_OTHER): Payer: Self-pay | Admitting: Nurse Practitioner

## 2023-01-23 DIAGNOSIS — I6523 Occlusion and stenosis of bilateral carotid arteries: Secondary | ICD-10-CM

## 2023-02-02 ENCOUNTER — Ambulatory Visit (INDEPENDENT_AMBULATORY_CARE_PROVIDER_SITE_OTHER): Payer: Medicare Other | Admitting: Vascular Surgery

## 2023-02-02 ENCOUNTER — Encounter (INDEPENDENT_AMBULATORY_CARE_PROVIDER_SITE_OTHER): Payer: Self-pay | Admitting: Nurse Practitioner

## 2023-02-02 ENCOUNTER — Ambulatory Visit (INDEPENDENT_AMBULATORY_CARE_PROVIDER_SITE_OTHER): Payer: Medicare Other

## 2023-02-02 ENCOUNTER — Ambulatory Visit (INDEPENDENT_AMBULATORY_CARE_PROVIDER_SITE_OTHER): Payer: Medicare Other | Admitting: Nurse Practitioner

## 2023-02-02 VITALS — BP 129/74 | HR 60 | Resp 16 | Ht 66.0 in | Wt 121.6 lb

## 2023-02-02 DIAGNOSIS — E7800 Pure hypercholesterolemia, unspecified: Secondary | ICD-10-CM

## 2023-02-02 DIAGNOSIS — I6523 Occlusion and stenosis of bilateral carotid arteries: Secondary | ICD-10-CM

## 2023-02-02 DIAGNOSIS — I10 Essential (primary) hypertension: Secondary | ICD-10-CM

## 2023-02-25 ENCOUNTER — Encounter (INDEPENDENT_AMBULATORY_CARE_PROVIDER_SITE_OTHER): Payer: Self-pay | Admitting: Nurse Practitioner

## 2023-02-25 NOTE — Progress Notes (Signed)
Subjective:    Patient ID: Tanya Larson, female    DOB: 10-01-1933, 89 y.o.   MRN: [id number] Chief Complaint  Patient presents with   Follow-up    f/u in 6 months with carotid     The patient is seen for follow up evaluation of carotid stenosis. The carotid stenosis followed by ultrasound.   The patient denies amaurosis fugax. There is no recent history of TIA symptoms or focal motor deficits. There is no prior documented CVA.  The patient is taking enteric-coated aspirin 81 mg daily.  There is no history of migraine headaches. There is no history of seizures.  No recent shortening of the patient's walking distance or new symptoms consistent with claudication.  No history of rest pain symptoms. No new ulcers or wounds of the lower extremities have occurred.  There is no history of DVT, PE or superficial thrombophlebitis. No recent episodes of angina or shortness of breath documented.   Carotid Duplex done today shows 60 to 79% stenosis of the right ICA with 1 to 39% stenosis of the left ICA.  Velocities slightly increased in the right ICA from previous study 6 months ago.  Bilateral vertebral arteries have antegrade flow with normal flow hemodynamics in the bilateral subclavian arteries.    Review of Systems  All other systems reviewed and are negative.      Objective:   Physical Exam Vitals reviewed.  HENT:     Head: Normocephalic.  Neck:     Vascular: No carotid bruit.  Cardiovascular:     Rate and Rhythm: Normal rate.     Pulses: Normal pulses.  Pulmonary:     Effort: Pulmonary effort is normal.  Skin:    General: Skin is warm and dry.  Neurological:     Mental Status: She is alert and oriented to person, place, and time.  Psychiatric:        Mood and Affect: Mood normal.        Behavior: Behavior normal.        Thought Content: Thought content normal.        Judgment: Judgment normal.     BP 129/74 (BP Location: Left Arm)   Pulse 60   Resp 16   Ht  5\' 6"  (1.676 m)   Wt 121 lb 9.6 oz (55.2 kg)   BMI 19.63 kg/m   Past Medical History:  Diagnosis Date   Aortic atherosclerosis (HCC)    Per New Patient Packet   Aortic stenosis, moderate    Per New Patient Packet   Arthritis    Per New Patient Packet   Bilateral carotid artery stenosis    Per New Patient Packet   Depressive disorder    Per New Patient Packet   GERD (gastroesophageal reflux disease)    Per New Patient Packet   H/O bone density study    Prior to 2019, Per New Patient Packet   History of colonoscopy    Prior to 2019, Per New Patient Packet   History of mammogram    Prior to 2019, Per New Patient Packet   History of Papanicolaou smear of cervix    Prior to 2019, Per New Patient Packet   Hypertension    Hypothyroidism    Per New Patient Packet   Senile osteopenia    Per New Patient Packet   Stage 3 chronic kidney disease [facility])    Per New Patient Packet    Social History   Socioeconomic History   Marital  status: Unknown    Spouse name: Not on file   Number of children: Not on file   Years of education: Not on file   Highest education level: Not on file  Occupational History   Not on file  Tobacco Use   Smoking status: Former    Types: Cigarettes    Quit date: 14    Years since quitting: 41.3   Smokeless tobacco: Never  Vaping Use   Vaping Use: Never used  Substance and Sexual Activity   Alcohol use: Yes    Comment: social drinker   Drug use: Never   Sexual activity: Not on file  Other Topics Concern   Not on file  Social History Narrative   Not on file   Social Determinants of Health   Financial Resource Strain: Not on file  Food Insecurity: Not on file  Transportation Needs: Not on file  Physical Activity: Not on file  Stress: Not on file  Social Connections: Not on file  Intimate Partner Violence: Not on file    Past Surgical History:  Procedure Laterality Date   ANKLE SURGERY Right 2019   APPENDECTOMY     arm surgery Right     EYE SURGERY      Family History  Problem Relation Age of Onset   Aortic stenosis Mother    Dementia Father    Congestive Heart Failure Brother    Kidney disease Brother    Suicidality Brother     Allergies  Allergen Reactions   Erythromycin Hives    Mouth breaks out       Latest Ref Rng & Units 05/04/2022   12:02 PM  CBC  WBC 4.0 - 10.5 K/uL 10.1   Hemoglobin 12.0 - 15.0 g/dL 38.1   Hematocrit 01.7 - 46.0 % 43.0   Platelets 150 - 400 K/uL 211       CMP     Component Value Date/Time   NA 135 05/04/2022 1202   K 5.0 05/04/2022 1202   CL 105 05/04/2022 1202   CO2 22 05/04/2022 1202   GLUCOSE 109 (H) 05/04/2022 1202   BUN 12 05/04/2022 1202   CREATININE 1.23 (H) 05/04/2022 1202   CALCIUM 9.2 05/04/2022 1202   GFRNONAA 43 (L) 05/04/2022 1202     No results found.     Assessment & Plan:   1. Bilateral carotid artery stenosis Recommend:  Given the patient's asymptomatic subcritical stenosis no further invasive testing or surgery at this time.  Duplex ultrasound shows 60 to 79% stenosis of the right ICA with 1 to 39% stenosis of the left ICA.  This is consistent with previous study 6 months ago   Continue antiplatelet therapy as prescribed Continue management of CAD, HTN and Hyperlipidemia Healthy heart diet,  encouraged exercise at least 4 times per week Follow up in 6 months with duplex ultrasound and physical exam   - VAS US CAROTID  2. Pure hypercholesterolemia Continue statin as ordered and reviewed, no changes at this time   3. Primary hypertension Continue antihypertensive medications as already ordered, these medications have been reviewed and there are no changes at this time.    Current Outpatient Medications on File Prior to Visit  Medication Sig Dispense Refill   ALPRAZolam (XANAX) 0.5 MG tablet Take 0.25 mg by mouth as needed.     amLODipine (NORVASC) 2.5 MG tablet Take 2.5 mg by mouth daily.     clopidogrel (PLAVIX) 75 MG tablet TAKE  1 TABLET DAILY 90  tablet 3   escitalopram (LEXAPRO) 10 MG tablet Take 1 tablet by mouth daily.     levothyroxine (SYNTHROID) 100 MCG tablet Take 100 mcg by mouth daily.     metoprolol tartrate (LOPRESSOR) 50 MG tablet Take 50 mg by mouth daily.     Multiple Vitamins-Minerals (PRESERVISION AREDS 2) CAPS Take 2 capsules by mouth daily at 12 noon.     pravastatin (PRAVACHOL) 40 MG tablet Take 40 mg by mouth daily.     valsartan (DIOVAN) 40 MG tablet Take 40 mg by mouth daily.     Biotin 5000 MCG CAPS Take 2 capsules by mouth daily. (Patient not taking: Reported on 02/02/2023)     No current facility-administered medications on file prior to visit.    There are no Patient Instructions on file for this visit. No follow-ups on file.   Georgiana Spinner, NP

## 2023-03-22 ENCOUNTER — Ambulatory Visit: Payer: Medicare Other | Admitting: Nurse Practitioner

## 2023-05-25 ENCOUNTER — Ambulatory Visit: Payer: Medicare Other | Admitting: Student

## 2023-05-25 VITALS — BP 132/76 | HR 65 | Temp 97.9°F | Ht 66.0 in | Wt 122.0 lb

## 2023-05-25 DIAGNOSIS — H66004 Acute suppurative otitis media without spontaneous rupture of ear drum, recurrent, right ear: Secondary | ICD-10-CM

## 2023-05-25 MED ORDER — AMOXICILLIN-POT CLAVULANATE 875-125 MG PO TABS: 1.0000 | ORAL_TABLET | Freq: Two times a day (BID) | ORAL | 0 refills | Status: DC

## 2023-05-25 NOTE — Patient Instructions (Addendum)
Please take Augmentin in the morning and evening with meals for the next 10 days.   Please follow up with your PCP in September to reevaluate and determine need for a second opinion.

## 2023-05-25 NOTE — Progress Notes (Signed)
[facility] clinic [facility].   Provider: Dr. Earnestine Mealing  Code Status: DNR Goals of Care:     05/25/2023    1:43 PM  Advanced Directives  Does Patient Have a Medical Advance Directive? Yes  Type of Estate agent of [place];Out of facility DNR (pink MOST or yellow form)  Does patient want to make changes to medical advance directive? No - Patient declined  Copy of Healthcare Power of Attorney in Chart? No - copy requested     Chief Complaint  Patient presents with   Acute Visit    Was in [facility] this morning and Janci observed Possible Right Eardrum Rupture    HPI: Patient is a 89 y.o. female seen today for an acute visit for Was in [facility] this morning and Janci observed Possible Right Eardrum Rupture  She had her ears irrigated today and there  was some concern for rupture on the right. She wears hearing aids. She still feels like it's a little stopped up.   She wonders if she has had a ruptured eardrum for a long time while her husband was in the [employer].   Past Medical History:  Diagnosis Date   Aortic atherosclerosis [facility])    Per New Patient Packet   Aortic stenosis, moderate    Per New Patient Packet   Arthritis    Per New Patient Packet   Bilateral carotid artery stenosis    Per New Patient Packet   Depressive disorder    Per New Patient Packet   GERD (gastroesophageal reflux disease)    Per New Patient Packet   H/O bone density study    Prior to 2019, Per New Patient Packet   History of colonoscopy    Prior to 2019, Per New Patient Packet   History of mammogram    Prior to 2019, Per New Patient Packet   History of Papanicolaou smear of cervix    Prior to 2019, Per New Patient Packet   Hypertension    Hypothyroidism    Per New Patient Packet   Senile osteopenia    Per New Patient Packet   Stage 3 chronic kidney disease [facility])    Per New Patient Packet    Past Surgical History:  Procedure Laterality Date   ANKLE  SURGERY Right 2019   APPENDECTOMY     arm surgery Right    EYE SURGERY      Allergies  Allergen Reactions   Erythromycin Hives    Mouth breaks out    Outpatient Encounter Medications as of 05/25/2023  Medication Sig   ALPRAZolam (XANAX) 0.5 MG tablet Take 0.25 mg by mouth as needed.   amLODipine (NORVASC) 2.5 MG tablet Take 2.5 mg by mouth daily.   Biotin 5000 MCG CAPS Take 2 capsules by mouth daily.   clopidogrel (PLAVIX) 75 MG tablet TAKE 1 TABLET DAILY   escitalopram (LEXAPRO) 10 MG tablet Take 1 tablet by mouth daily.   levothyroxine (SYNTHROID) 100 MCG tablet Take 100 mcg by mouth daily.   metoprolol tartrate (LOPRESSOR) 50 MG tablet Take 50 mg by mouth daily.   Multiple Vitamins-Minerals (PRESERVISION AREDS 2) CAPS Take 2 capsules by mouth daily at 12 noon.   pravastatin (PRAVACHOL) 40 MG tablet Take 40 mg by mouth daily.   valsartan (DIOVAN) 40 MG tablet Take 40 mg by mouth daily.   No facility-administered encounter medications on file as of 05/25/2023.    Review of Systems:  Review of Systems  Health Maintenance  Topic Date Due   Medicare Annual Wellness (AWV)  Never done   Zoster Vaccines- Shingrix (1 of 2) 10/16/1985   DEXA SCAN  Never done   COVID-19 Vaccine (6 - 2023-24 season) 06/23/2022   DTaP/Tdap/Td (2 - Tdap) 12/31/2022   INFLUENZA VACCINE  05/24/2023   Pneumonia Vaccine 43+ Years old  Completed   HPV VACCINES  Aged Out    Physical Exam: Vitals:   05/25/23 1341  BP: 132/76  Pulse: 65  Temp: 97.9 F (36.6 C)  SpO2: 95%  Weight: 122 lb (55.3 kg)  Height: 5\' 6"  (1.676 m)   Body mass index is 19.69 kg/m. Physical Exam Vitals reviewed.  Constitutional:      Appearance: Normal appearance.  HENT:     Left Ear: Tympanic membrane normal.     Ears:     Comments: Right tympanic membrane bulging and erythematous Neurological:     Mental Status: She is alert.     Labs reviewed: Basic Metabolic Panel: No results for input(s): "NA", "K", "CL",  "CO2", "GLUCOSE", "BUN", "CREATININE", "CALCIUM", "MG", "PHOS", "TSH" in the last 8760 hours. Liver Function Tests: No results for input(s): "AST", "ALT", "ALKPHOS", "BILITOT", "PROT", "ALBUMIN" in the last 8760 hours. No results for input(s): "LIPASE", "AMYLASE" in the last 8760 hours. No results for input(s): "AMMONIA" in the last 8760 hours. CBC: No results for input(s): "WBC", "NEUTROABS", "HGB", "HCT", "MCV", "PLT" in the last 8760 hours. Lipid Panel: No results for input(s): "CHOL", "HDL", "LDLCALC", "TRIG", "CHOLHDL", "LDLDIRECT" in the last 8760 hours. No results found for: "HGBA1C"  Procedures since last visit: No results found.  Assessment/Plan Recurrent acute suppurative otitis media of right ear without spontaneous rupture of tympanic membrane - Plan: amoxicillin-clavulanate (AUGMENTIN) 875-125 MG tablet Does not appear ruptured at this time. Treat with Abx  Follow up with PCP in 1 mo  Labs/tests ordered:  * No order type specified * Next appt:  06/26/2023

## 2023-06-26 ENCOUNTER — Encounter: Payer: Medicare Other | Admitting: Nurse Practitioner

## 2023-08-06 ENCOUNTER — Other Ambulatory Visit (INDEPENDENT_AMBULATORY_CARE_PROVIDER_SITE_OTHER): Payer: Self-pay | Admitting: Nurse Practitioner

## 2023-08-06 DIAGNOSIS — I6523 Occlusion and stenosis of bilateral carotid arteries: Secondary | ICD-10-CM

## 2023-08-07 ENCOUNTER — Ambulatory Visit (INDEPENDENT_AMBULATORY_CARE_PROVIDER_SITE_OTHER): Payer: Medicare Other | Admitting: Vascular Surgery

## 2023-08-07 ENCOUNTER — Ambulatory Visit (INDEPENDENT_AMBULATORY_CARE_PROVIDER_SITE_OTHER): Payer: Medicare Other

## 2023-08-07 ENCOUNTER — Encounter (INDEPENDENT_AMBULATORY_CARE_PROVIDER_SITE_OTHER): Payer: Self-pay | Admitting: Vascular Surgery

## 2023-08-07 VITALS — BP 154/78 | HR 60 | Resp 16 | Wt 121.4 lb

## 2023-08-07 DIAGNOSIS — E7800 Pure hypercholesterolemia, unspecified: Secondary | ICD-10-CM

## 2023-08-07 DIAGNOSIS — I10 Essential (primary) hypertension: Secondary | ICD-10-CM | POA: Diagnosis not present

## 2023-08-07 DIAGNOSIS — I6523 Occlusion and stenosis of bilateral carotid arteries: Secondary | ICD-10-CM | POA: Diagnosis not present

## 2023-08-07 NOTE — Progress Notes (Signed)
MRN : [id number]  Tanya Larson is a 89 y.o. ([birth year]) female who presents with chief complaint of  Chief Complaint  Patient presents with   Follow-up    6 month carotid u/s   .  History of Present Illness: Patient returns today in follow up of her carotid disease.  She is doing well today.  She denies any focal neurologic symptoms. Specifically, the patient denies amaurosis fugax, speech or swallowing difficulties, or arm or leg weakness or numbness.  Her carotid duplex today shows stable 60 to 79% right ICA stenosis and 1 to 39% left ICA stenosis without progression from previous studies.  Current Outpatient Medications  Medication Sig Dispense Refill   ALPRAZolam (XANAX) 0.5 MG tablet Take 0.25 mg by mouth as needed.     amLODipine (NORVASC) 2.5 MG tablet Take 2.5 mg by mouth daily.     clopidogrel (PLAVIX) 75 MG tablet TAKE 1 TABLET DAILY 90 tablet 3   escitalopram (LEXAPRO) 10 MG tablet Take 1 tablet by mouth daily.     levothyroxine (SYNTHROID) 100 MCG tablet Take 100 mcg by mouth daily.     metoprolol tartrate (LOPRESSOR) 50 MG tablet Take 50 mg by mouth daily.     Multiple Vitamins-Minerals (PRESERVISION AREDS 2) CAPS Take 2 capsules by mouth daily at 12 noon.     pravastatin (PRAVACHOL) 40 MG tablet Take 40 mg by mouth daily.     valsartan (DIOVAN) 40 MG tablet Take 40 mg by mouth daily.     amoxicillin-clavulanate (AUGMENTIN) 875-125 MG tablet Take 1 tablet by mouth 2 (two) times daily. 20 tablet 0   Biotin 5000 MCG CAPS Take 2 capsules by mouth daily.     No current facility-administered medications for this visit.    Past Medical History:  Diagnosis Date   Aortic atherosclerosis [facility])    Per New Patient Packet   Aortic stenosis, moderate    Per New Patient Packet   Arthritis    Per New Patient Packet   Bilateral carotid artery stenosis    Per New Patient Packet   Depressive disorder    Per New Patient Packet   GERD (gastroesophageal reflux disease)     Per New Patient Packet   H/O bone density study    Prior to 2019, Per New Patient Packet   History of colonoscopy    Prior to 2019, Per New Patient Packet   History of mammogram    Prior to 2019, Per New Patient Packet   History of Papanicolaou smear of cervix    Prior to 2019, Per New Patient Packet   Hypertension    Hypothyroidism    Per New Patient Packet   Senile osteopenia    Per New Patient Packet   Stage 3 chronic kidney disease [facility])    Per New Patient Packet    Past Surgical History:  Procedure Laterality Date   ANKLE SURGERY Right 2019   APPENDECTOMY     arm surgery Right    EYE SURGERY       Social History   Tobacco Use   Smoking status: Former    Current packs/day: 0.00    Types: Cigarettes    Quit date: 1983    Years since quitting: 41.8   Smokeless tobacco: Never  Vaping Use   Vaping status: Never Used  Substance Use Topics   Alcohol use: Yes    Comment: social drinker   Drug use: Never      Family History  Problem Relation Age of Onset   Aortic stenosis Mother    Dementia Father    Congestive Heart Failure Brother    Kidney disease Brother    Suicidality Brother      Allergies  Allergen Reactions   Erythromycin Hives    Mouth breaks out     REVIEW OF SYSTEMS (Negative unless checked)  Constitutional: [] Weight loss  [] Fever  [] Chills Cardiac: [] Chest pain   [] Chest pressure   [] Palpitations   [] Shortness of breath when laying flat   [] Shortness of breath at rest   [] Shortness of breath with exertion. Vascular:  [] Pain in legs with walking   [] Pain in legs at rest   [] Pain in legs when laying flat   [] Claudication   [] Pain in feet when walking  [] Pain in feet at rest  [] Pain in feet when laying flat   [] History of DVT   [] Phlebitis   [] Swelling in legs   [] Varicose veins   [] Non-healing ulcers Pulmonary:   [] Uses home oxygen   [] Productive cough   [] Hemoptysis   [] Wheeze  [] COPD   [] Asthma Neurologic:  [] Dizziness  [] Blackouts    [] Seizures   [] History of stroke   [] History of TIA  [] Aphasia   [] Temporary blindness   [] Dysphagia   [] Weakness or numbness in arms   [] Weakness or numbness in legs Musculoskeletal:  [x] Arthritis   [] Joint swelling   [x] Joint pain   [] Low back pain Hematologic:  [] Easy bruising  [] Easy bleeding   [] Hypercoagulable state   [] Anemic   Gastrointestinal:  [] Blood in stool   [] Vomiting blood  [x] Gastroesophageal reflux/heartburn   [] Abdominal pain Genitourinary:  [x] Chronic kidney disease   [] Difficult urination  [] Frequent urination  [] Burning with urination   [] Hematuria Skin:  [] Rashes   [] Ulcers   [] Wounds Psychological:  [] History of anxiety   []  History of major depression.  Physical Examination  BP (!) 154/78 (BP Location: Left Arm)   Pulse 60   Resp 16   Wt 121 lb 6.4 oz (55.1 kg)   BMI 19.59 kg/m  Gen:  WD/WN, NAD. Appears much younger than stated age. Head: /AT, No temporalis wasting. Ear/Nose/Throat: Hearing grossly intact, nares w/o erythema or drainage Eyes: Conjunctiva clear. Sclera non-icteric Neck: Supple.  Trachea midline. Soft right carotid bruit Pulmonary:  Good air movement, no use of accessory muscles.  Cardiac: RRR, no JVD Vascular:  Vessel Right Left  Radial Palpable Palpable                          PT Palpable Palpable  DP Palpable Palpable   Gastrointestinal: soft, non-tender/non-distended. No guarding/reflex.  Musculoskeletal: M/S 5/5 throughout.  No deformity or atrophy. No edema. Neurologic: Sensation grossly intact in extremities.  Symmetrical.  Speech is fluent.  Psychiatric: Judgment intact, Mood & affect appropriate for pt's clinical situation. Dermatologic: No rashes or ulcers noted.  No cellulitis or open wounds.      Labs No results found for this or any previous visit (from the past 2160 hour(s)).  Radiology No results found.  Assessment/Plan Hypertensive disorder blood pressure control important in reducing the progression of  atherosclerotic disease. On appropriate oral medications.     Pure hypercholesterolemia lipid control important in reducing the progression of atherosclerotic disease. Continue statin therapy  Bilateral carotid artery stenosis Her carotid duplex today shows stable 60 to 79% right ICA stenosis and 1 to 39% left ICA stenosis without progression from previous studies.  Continue current medical regimen  which includes Pravachol and Plavix.  Follow-up in 6 months with duplex.    Festus Barren, MD  08/07/2023 12:54 PM    This note was created with Dragon medical transcription system.  Any errors from dictation are purely unintentional

## 2023-08-07 NOTE — Assessment & Plan Note (Signed)
Her carotid duplex today shows stable 60 to 79% right ICA stenosis and 1 to 39% left ICA stenosis without progression from previous studies.  Continue current medical regimen which includes Pravachol and Plavix.  Follow-up in 6 months with duplex.

## 2023-09-24 ENCOUNTER — Other Ambulatory Visit (INDEPENDENT_AMBULATORY_CARE_PROVIDER_SITE_OTHER): Payer: Self-pay | Admitting: Nurse Practitioner

## 2024-02-05 ENCOUNTER — Ambulatory Visit (INDEPENDENT_AMBULATORY_CARE_PROVIDER_SITE_OTHER): Payer: Medicare Other | Admitting: Vascular Surgery

## 2024-02-22 ENCOUNTER — Encounter: Payer: Self-pay | Admitting: Student

## 2024-02-22 ENCOUNTER — Ambulatory Visit (SKILLED_NURSING_FACILITY): Admitting: Student

## 2024-02-22 VITALS — BP 126/78 | HR 72 | Temp 97.4°F | Ht 66.0 in | Wt 118.0 lb

## 2024-02-22 DIAGNOSIS — I700 Atherosclerosis of aorta: Secondary | ICD-10-CM

## 2024-02-22 DIAGNOSIS — M25551 Pain in right hip: Secondary | ICD-10-CM | POA: Diagnosis not present

## 2024-02-22 DIAGNOSIS — K219 Gastro-esophageal reflux disease without esophagitis: Secondary | ICD-10-CM

## 2024-02-22 DIAGNOSIS — E039 Hypothyroidism, unspecified: Secondary | ICD-10-CM

## 2024-02-22 DIAGNOSIS — N1831 Chronic kidney disease, stage 3a: Secondary | ICD-10-CM | POA: Diagnosis not present

## 2024-02-22 DIAGNOSIS — F339 Major depressive disorder, recurrent, unspecified: Secondary | ICD-10-CM | POA: Diagnosis not present

## 2024-02-22 DIAGNOSIS — I10 Essential (primary) hypertension: Secondary | ICD-10-CM

## 2024-02-22 DIAGNOSIS — M8580 Other specified disorders of bone density and structure, unspecified site: Secondary | ICD-10-CM

## 2024-02-22 DIAGNOSIS — E782 Mixed hyperlipidemia: Secondary | ICD-10-CM

## 2024-02-22 DIAGNOSIS — I6523 Occlusion and stenosis of bilateral carotid arteries: Secondary | ICD-10-CM

## 2024-02-22 DIAGNOSIS — I350 Nonrheumatic aortic (valve) stenosis: Secondary | ICD-10-CM

## 2024-02-22 NOTE — Progress Notes (Signed)
 [facility] POS: [facility] Provider: Jann Melody  Code Status: Full Code Goals of Care:     02/22/2024    8:59 AM  Advanced Directives  Does Patient Have a Medical Advance Directive? Yes  Type of Advance Directive Out of facility DNR (pink MOST or yellow form);Living will  Does patient want to make changes to medical advance directive? No - Patient declined     Chief Complaint  Patient presents with   Establish Care    NP to Establish. Complains of Right hip pain.     HPI: Patient is a 89 y.o. female seen today for medical management of chronic diseases.  She is transitioning primary care for convenience.   Discussed the use of AI scribe software for clinical note transcription with the patient, who gave verbal consent to proceed.  History of Present Illness   Tanya Larson "Jolly Needle" is an 89 year old female who presents with right hip pain.  She has been experiencing intermittent right hip pain since August, which has recently become more severe. She uses Salonpas patches, Tylenol, and gabapentin for pain management. Initially prescribed 300 mg of gabapentin, her dosage was adjusted to 100 mg three times a day, which she has been taking for about four months. Gabapentin provides some relief but causes decreased energy levels. She previously attended physical therapy, which was beneficial, but her insurance no longer covers it. She continues to perform some exercises at home.  She has experienced unintentional weight loss of 18 to 19 pounds over the past two years, accompanied by a decrease in appetite. She attributes some of the weight loss to living alone and the challenges of cooking for one. Her breakfast typically consists of a homemade protein shake containing cranberry juice, whole milk, orange juice, yogurt, peanut butter, and plant-based protein, though she is unsure of the exact protein content. She dislikes milk but enjoys ice cream.  She has a history of  hypothyroidism, managed with levothyroxine for 60 years, with stable thyroid levels as of February. She also has carotid artery stenosis, with the most recent ultrasound in October showing 60-79% stenosis in the right ICA and less than 39% in the left ICA. She is on Plavix  and Pravachol for this condition and has regular follow-ups every six months.  She has a history of depression and was on Lexapro for a long time. She recently tried duloxetine but discontinued it due to dissatisfaction, stopping about a month ago. She is considering restarting Lexapro. Her family history includes depression, with her brother having committed suicide. She describes her mood as fluctuating but generally tries to remain positive. She has not consulted a psychiatrist in about 20 years.  She has moderate aortic stenosis but denies symptoms such as shortness of breath with exertion. She also has osteopenia and is on amlodipine and valsartan for hypertension. She takes valsartan twice a day but is unsure of the reason for this dosing. She also takes Preservision for macular degeneration.  She reports memory changes over the past six months, such as difficulty remembering where she placed things or what she wrote down. She consumes two to four glasses of wine a week and has no trouble with sleep. She has an exposed bone in her ear and sees an ENT for this issue. She wears hearing aids but sometimes chooses not to use them.         Past Medical History:  Diagnosis Date   Aortic atherosclerosis (HCC)  Per New Patient Packet   Aortic stenosis, moderate    Per New Patient Packet   Arthritis    Per New Patient Packet   Bilateral carotid artery stenosis    Per New Patient Packet   Depressive disorder    Per New Patient Packet   GERD (gastroesophageal reflux disease)    Per New Patient Packet   H/O bone density study    Prior to 2019, Per New Patient Packet   History of colonoscopy    Prior to 2019, Per New  Patient Packet   History of mammogram    Prior to 2019, Per New Patient Packet   History of Papanicolaou smear of cervix    Prior to 2019, Per New Patient Packet   Hypertension    Hypothyroidism    Per New Patient Packet   Senile osteopenia    Per New Patient Packet   Stage 3 chronic kidney disease [facility])    Per New Patient Packet    Past Surgical History:  Procedure Laterality Date   ANKLE SURGERY Right 2019   APPENDECTOMY     arm surgery Right    EYE SURGERY      Allergies  Allergen Reactions   Erythromycin Hives    Mouth breaks out    Outpatient Encounter Medications as of 02/22/2024  Medication Sig   acetaminophen (TYLENOL) 500 MG tablet Take 500 mg by mouth every 8 (eight) hours as needed.   amLODipine (NORVASC) 2.5 MG tablet Take 2.5 mg by mouth daily.   clopidogrel  (PLAVIX ) 75 MG tablet Take 75 mg by mouth daily.   escitalopram (LEXAPRO) 10 MG tablet Take 1 tablet by mouth daily.   gabapentin (NEURONTIN) 100 MG tablet Take 200 mg by mouth at bedtime.   levothyroxine (SYNTHROID) 100 MCG tablet Take 100 mcg by mouth daily.   metoprolol tartrate (LOPRESSOR) 50 MG tablet Take 50 mg by mouth daily.   Multiple Vitamins-Minerals (PRESERVISION AREDS 2) CAPS Take 2 capsules by mouth daily at 12 noon.   pravastatin (PRAVACHOL) 40 MG tablet Take 40 mg by mouth daily.   valsartan (DIOVAN) 40 MG tablet Take 80 mg by mouth daily.   [DISCONTINUED] ALPRAZolam (XANAX) 0.5 MG tablet Take 0.25 mg by mouth as needed.   [DISCONTINUED] amoxicillin -clavulanate (AUGMENTIN ) 875-125 MG tablet Take 1 tablet by mouth 2 (two) times daily.   [DISCONTINUED] Biotin 5000 MCG CAPS Take 2 capsules by mouth daily.   [DISCONTINUED] clopidogrel  (PLAVIX ) 75 MG tablet TAKE 1 TABLET DAILY   No facility-administered encounter medications on file as of 02/22/2024.    Review of Systems:  Review of Systems  Health Maintenance  Topic Date Due   Medicare Annual Wellness (AWV)  Never done   Zoster Vaccines-  Shingrix (1 of 2) 10/16/1985   DEXA SCAN  Never done   DTaP/Tdap/Td (2 - Tdap) 12/31/2022   COVID-19 Vaccine (7 - 2024-25 season) 06/24/2023   INFLUENZA VACCINE  05/23/2024   Pneumonia Vaccine 67+ Years old  Completed   HPV VACCINES  Aged Out   Meningococcal B Vaccine  Aged Out    Physical Exam: Vitals:   02/22/24 0850  BP: 126/78  Pulse: 72  Temp: (!) 97.4 F (36.3 C)  SpO2: 97%  Weight: 118 lb (53.5 kg)  Height: 5\' 6"  (1.676 m)   Body mass index is 19.05 kg/m. Physical Exam Constitutional:      Appearance: Normal appearance.  HENT:     Left Ear: Tympanic membrane normal.  Ears:     Comments: Right ear - exposed bone    Mouth/Throat:     Mouth: Mucous membranes are moist.     Pharynx: No oropharyngeal exudate or posterior oropharyngeal erythema.  Eyes:     Extraocular Movements: Extraocular movements intact.     Pupils: Pupils are equal, round, and reactive to light.  Cardiovascular:     Rate and Rhythm: Normal rate and regular rhythm.     Pulses: Normal pulses.     Heart sounds: Normal heart sounds.  Pulmonary:     Effort: Pulmonary effort is normal.  Abdominal:     General: Abdomen is flat. Bowel sounds are normal.     Palpations: Abdomen is soft.  Musculoskeletal:        General: No swelling or tenderness.     Cervical back: Normal range of motion and neck supple. No rigidity or tenderness.     Comments: Pain with external rotation of the hip on the right. Negative straight leg raising. Left hip normal strength and ROM  Skin:    General: Skin is warm and dry.     Capillary Refill: Capillary refill takes 2 to 3 seconds.  Neurological:     Mental Status: She is alert and oriented to person, place, and time.     Gait: Gait abnormal.     Comments: Wide gait  Psychiatric:        Mood and Affect: Mood normal.    Physical Exam   MEASUREMENTS: Weight- 118 lbs. MUSCULOSKELETAL: Pain with external rotation of right hip.      Labs reviewed: Basic  Metabolic Panel: No results for input(s): "NA", "K", "CL", "CO2", "GLUCOSE", "BUN", "CREATININE", "CALCIUM", "MG", "PHOS", "TSH" in the last 8760 hours. Liver Function Tests: No results for input(s): "AST", "ALT", "ALKPHOS", "BILITOT", "PROT", "ALBUMIN" in the last 8760 hours. No results for input(s): "LIPASE", "AMYLASE" in the last 8760 hours. No results for input(s): "AMMONIA" in the last 8760 hours. CBC: No results for input(s): "WBC", "NEUTROABS", "HGB", "HCT", "MCV", "PLT" in the last 8760 hours. Lipid Panel: No results for input(s): "CHOL", "HDL", "LDLCALC", "TRIG", "CHOLHDL", "LDLDIRECT" in the last 8760 hours. No results found for: "HGBA1C"  Procedures since last visit: No results found. Results   LABS TSH: 2.13 (11/2023)  RADIOLOGY Carotid Ultrasound: Right ICA: 60-79% stenosis, Left ICA: <39% stenosis (07/2023)      Assessment/Plan    Wellness Visit Routine wellness visit addressing memory concerns, mobility, and general health maintenance. Recent memory changes noted over six months, with potential impact of wine on memory and mood discussed. - Perform memory exercise at next visit - Discuss mobility and fall prevention strategies - Review medications for potential side effects  Weight loss Unintentional weight loss of 18-19 pounds over two years, with decreased appetite possibly related to living alone and difficulty cooking. Current weight is 118 pounds, down from 122 pounds in August. Emphasized importance of protein intake for weight maintenance and muscle mass preservation. - Provide information on protein content in foods - Encourage intake of 30 grams of protein per meal - Consider protein-fortified foods like Thrive ice cream or Ensure juice versions  Right hip pain Chronic right hip pain, more severe since August. Managed with Salonpas patches, Tylenol, and gabapentin 100 mg three times daily. Physical therapy was beneficial but discontinued due to insurance  limits. No recent x-ray on file. Discussed potential for x-ray to evaluate for arthritis or need for hip replacement. - Order x-ray of right hip -  Encourage continuation of physical therapy exercises - Consider switching to Salonpas with lidocaine for additional pain relief - Reduce gabapentin to nighttime dosing to assess impact on mood  Hypertension Hypertension managed with amlodipine and valsartan. Valsartan dosing adjusted to 80 mg once daily as per standard practice. - Adjust valsartan to 80 mg once daily  Moderate aortic stenosis Moderate aortic stenosis with no symptoms such as shortness of breath or exertional dyspnea. Last echocardiogram possibly not done since 2023. - Order echocardiogram to evaluate aortic stenosis progression  Carotid artery stenosis Carotid artery stenosis with right ICA 60-79% stenosis and left ICA <39% stenosis. Managed with Plavix  and Pravachol. Regular follow-up every six months with vascular specialist. Missed recent appointment, rescheduled for end of the month. Intervention typically not done unless near 100% occlusion unless symptomatic. - Continue Plavix  and Pravachol - Attend rescheduled vascular appointment  Age-related macular degeneration, wet Wet age-related macular degeneration managed with regular eye exams every six months and Preservision supplementation. - Continue regular eye exams every six months - Continue Preservision supplementation  Osteopenia Osteopenia noted.  Hypothyroidism Hypothyroidism managed with levothyroxine. Recent TSH level of 2.13 indicates well-managed thyroid function. - Continue current levothyroxine dosing  Depression Depression, currently not on medication. Previously on Lexapro and duloxetine, but stopped due to side effects. Considering restarting Lexapro. Discussed potential benefits of therapy in conjunction with medication and potential impact of gabapentin on mood. - Restart Lexapro 10 mg  daily  needed - Follow up in one month to assess mood and medication efficacy - Explore therapy options through [email or web address]  Cubital tunnel syndrome Cubital tunnel syndrome noted. no symptoms at this time.   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 Managed with baby oil and regular ENT follow-ups. Hearing aids are used. Discussed importance of wearing hearing aids to stimulate brain and impact mood and memory. - Continue ENT follow-ups - Use baby oil as instructed - Wear hearing aids as needed          Labs/tests ordered:  * No order type specified * Next appt:  03/26/2024

## 2024-02-22 NOTE — Patient Instructions (Signed)
 VISIT SUMMARY:  During your visit, we discussed your right hip pain, recent weight loss, memory changes, and general health maintenance. We reviewed your current medications and conditions, including hypertension, aortic stenosis, carotid artery stenosis, macular degeneration, osteopenia, hypothyroidism, depression, and cubital tunnel syndrome. We also addressed your exposed bone in the ear and the importance of wearing your hearing aids.  YOUR PLAN:  -RIGHT HIP PAIN: Your right hip pain has worsened since August. We will order an x-ray to check for arthritis or the need for a hip replacement. Continue using Salonpas patches and Tylenol, and consider switching to Salonpas with lidocaine for better pain relief. We will reduce your gabapentin to nighttime dosing to see if it improves your energy levels. Keep doing your physical therapy exercises at home.  -WEIGHT LOSS: You have lost 18-19 pounds over the past two years, likely due to decreased appetite and living alone. It's important to maintain your protein intake to preserve muscle mass. Aim for 30 grams of protein per meal and consider protein-fortified foods like Thrive ice cream or Ensure juice versions.  -MEMORY CHANGES: You have noticed memory changes over the past six months. We discussed how alcohol might affect your memory and mood. We will perform a memory exercise at your next visit and review your medications for potential side effects.  -HYPERTENSION: Your hypertension is managed with amlodipine and valsartan. We have adjusted your valsartan dose to 80 mg once daily.  -MODERATE AORTIC STENOSIS: You have moderate aortic stenosis, which means your aortic valve is narrowed. We will order an echocardiogram to check the progression of this condition.  -CAROTID ARTERY STENOSIS: You have carotid artery stenosis, which is a narrowing of the arteries in your neck. Continue taking Plavix  and Pravachol, and attend your rescheduled vascular  appointment at the end of the month.  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 Continue your regular eye exams every six months and take Preservision supplements.  -HYPOTHYROIDISM: Your hypothyroidism is well-managed with levothyroxine. Continue your current dose as your recent thyroid levels are stable.  -DEPRESSION: You have a history of depression and are considering restarting Lexapro. We discussed the benefits of therapy along with medication. Restart Lexapro as needed and follow up in one month to assess your mood and the effectiveness of the medication. Explore therapy options through [email or web address].  -EXPOSED BONE IN EAR: You have an exposed bone in your ear, likely from a past injury. Continue using baby oil as instructed and follow up with your ENT. Wearing your hearing aids is important for stimulating your brain and can impact your mood and memory.  INSTRUCTIONS:  1. Follow up in one month to assess your mood and the effectiveness of Lexapro. 2. Attend your rescheduled vascular appointment at the end of the month. 3. We will perform a memory exercise at your next visit. 4. Schedule an x-ray of your right hip. 5. Schedule an echocardiogram to evaluate your aortic stenosis.

## 2024-02-26 ENCOUNTER — Ambulatory Visit (INDEPENDENT_AMBULATORY_CARE_PROVIDER_SITE_OTHER): Admitting: Nurse Practitioner

## 2024-02-26 ENCOUNTER — Encounter (INDEPENDENT_AMBULATORY_CARE_PROVIDER_SITE_OTHER): Payer: Self-pay | Admitting: Nurse Practitioner

## 2024-02-26 ENCOUNTER — Ambulatory Visit (INDEPENDENT_AMBULATORY_CARE_PROVIDER_SITE_OTHER)

## 2024-02-26 ENCOUNTER — Encounter (INDEPENDENT_AMBULATORY_CARE_PROVIDER_SITE_OTHER)

## 2024-02-26 VITALS — BP 115/60 | HR 65 | Resp 16 | Ht 66.0 in | Wt 118.2 lb

## 2024-02-26 DIAGNOSIS — I10 Essential (primary) hypertension: Secondary | ICD-10-CM | POA: Diagnosis not present

## 2024-02-26 DIAGNOSIS — I6523 Occlusion and stenosis of bilateral carotid arteries: Secondary | ICD-10-CM | POA: Diagnosis not present

## 2024-02-26 DIAGNOSIS — E7800 Pure hypercholesterolemia, unspecified: Secondary | ICD-10-CM | POA: Diagnosis not present

## 2024-03-03 ENCOUNTER — Encounter (INDEPENDENT_AMBULATORY_CARE_PROVIDER_SITE_OTHER): Payer: Self-pay | Admitting: Nurse Practitioner

## 2024-03-03 NOTE — Progress Notes (Signed)
 Subjective:    Patient ID: Tanya Larson, female    DOB: 03-15-1933, 89 y.o.   MRN: [id number] Chief Complaint  Patient presents with   Follow-up    6 month follow up    The patient is seen for follow up evaluation of carotid stenosis. The carotid stenosis followed by ultrasound.   The patient denies amaurosis fugax. There is no recent history of TIA symptoms or focal motor deficits. There is no prior documented CVA.  The patient is taking enteric-coated aspirin 81 mg daily.  There is no history of migraine headaches. There is no history of seizures.  No recent shortening of the patient's walking distance or new symptoms consistent with claudication.  No history of rest pain symptoms. No new ulcers or wounds of the lower extremities have occurred.  There is no history of DVT, PE or superficial thrombophlebitis. No documented recent episodes of angina or shortness of breath documented.   Carotid Duplex done today shows 80 to 99% stenosis of the right ICA with 1 to 39% stenosis of the left.  This is a notable change from previous studies.    Review of Systems  All other systems reviewed and are negative.      Objective:    Physical Exam Vitals reviewed.  HENT:     Head: Normocephalic.  Cardiovascular:     Rate and Rhythm: Normal rate.     Pulses: Normal pulses.  Pulmonary:     Effort: Pulmonary effort is normal.  Skin:    General: Skin is warm and dry.  Neurological:     Mental Status: She is alert and oriented to person, place, and time.  Psychiatric:        Mood and Affect: Mood normal.        Behavior: Behavior normal.        Thought Content: Thought content normal.        Judgment: Judgment normal.     BP 115/60   Pulse 65   Resp 16   Ht 5\' 6"  (1.676 m)   Wt 118 lb 3.2 oz (53.6 kg)   BMI 19.08 kg/m   Past Medical History:  Diagnosis Date   Aortic atherosclerosis (HCC)    Per New Patient Packet   Aortic stenosis, moderate    Per New Patient  Packet   Arthritis    Per New Patient Packet   Bilateral carotid artery stenosis    Per New Patient Packet   Depressive disorder    Per New Patient Packet   GERD (gastroesophageal reflux disease)    Per New Patient Packet   H/O bone density study    Prior to 2019, Per New Patient Packet   History of colonoscopy    Prior to 2019, Per New Patient Packet   History of mammogram    Prior to 2019, Per New Patient Packet   History of Papanicolaou smear of cervix    Prior to 2019, Per New Patient Packet   Hypertension    Hypothyroidism    Per New Patient Packet   Senile osteopenia    Per New Patient Packet   Stage 3 chronic kidney disease [facility])    Per New Patient Packet    Social History   Socioeconomic History   Marital status: Widowed    Spouse name: Not on file   Number of children: Not on file   Years of education: Not on file   Highest education level: Associate degree: occupational, Scientist, product/process development, or vocational  program  Occupational History   Not on file  Tobacco Use   Smoking status: Former    Current packs/day: 0.00    Types: Cigarettes    Quit date: 21    Years since quitting: 42.3   Smokeless tobacco: Never  Vaping Use   Vaping status: Never Used  Substance and Sexual Activity   Alcohol use: Yes    Comment: social drinker   Drug use: Never   Sexual activity: Not on file  Other Topics Concern   Not on file  Social History Narrative   Not on file   Social Drivers of Health   Financial Resource Strain: Low Risk  (12/25/2023)   Received from [facility]   Overall Financial Resource Strain (CARDIA)    Difficulty of Paying Living Expenses: Not hard at all  Food Insecurity: No Food Insecurity (12/25/2023)   Received from [facility]   Hunger Vital Sign    Worried About Running Out of Food in the Last Year: Never true    Ran Out of Food in the Last Year: Never true  Transportation Needs: No Transportation Needs (12/25/2023)    Received from [facility] - Transportation    In the past 12 months, has lack of transportation kept you from medical appointments or from getting medications?: No    Lack of Transportation (Non-Medical): No  Physical Activity: Sufficiently Active (05/25/2023)   Exercise Vital Sign    Days of Exercise per Week: 7 days    Minutes of Exercise per Session: 30 min  Stress: No Stress Concern Present (05/25/2023)   [employer] of Occupational Health - Occupational Stress Questionnaire    Feeling of Stress : Only a little  Social Connections: Unknown (05/25/2023)   Social Connection and Isolation Panel [NHANES]    Frequency of Communication with Friends and Family: More than three times a week    Frequency of Social Gatherings with Friends and Family: Twice a week    Attends Religious Services: More than 4 times per year    Active Member of [employer] or Organizations: Not on file    Attends Banker Meetings: Not on file    Marital Status: Widowed  Intimate Partner Violence: Not on file    Past Surgical History:  Procedure Laterality Date   ANKLE SURGERY Right 2019   APPENDECTOMY     arm surgery Right    EYE SURGERY      Family History  Problem Relation Age of Onset   Aortic stenosis Mother    Dementia Father    Congestive Heart Failure Brother    Kidney disease Brother    Suicidality Brother     Allergies  Allergen Reactions   Erythromycin Hives    Mouth breaks out       Latest Ref Rng & Units 05/04/2022   12:02 PM  CBC  WBC 4.0 - 10.5 K/uL 10.1   Hemoglobin 12.0 - 15.0 g/dL 82.9   Hematocrit 56.2 - 46.0 % 43.0   Platelets 150 - 400 K/uL 211        CMP     Component Value Date/Time   NA 135 05/04/2022 1202   K 5.0 05/04/2022 1202   CL 105 05/04/2022 1202   CO2 22 05/04/2022 1202   GLUCOSE 109 (H) 05/04/2022 1202   BUN 12 05/04/2022 1202   CREATININE 1.23 (H) 05/04/2022 1202   CALCIUM 9.2 05/04/2022 1202   GFRNONAA 43  (  L) 05/04/2022 1202     No results found.     Assessment & Plan:   1. Bilateral carotid artery stenosis (Primary) Recommend:  The patient remains asymptomatic with respect to the carotid stenosis.  However, the patient has now progressed and has a lesion the is >75%.  Patient should undergo CT angiography of the carotid arteries to define the degree of stenosis of the internal carotid arteries bilaterally and the anatomic suitability for surgery.  If the patient does indeed need surgery cardiac clearance will be required, once cleared the patient will be scheduled for surgery.  The risks, benefits and alternative therapies were reviewed in detail with the patient.  All questions were answered.  The patient agrees to proceed with imaging.  Continue antiplatelet therapy as prescribed. Continue management of CAD, HTN and Hyperlipidemia. Healthy heart diet, encouraged exercise at least 4 times per week.  - CT ANGIO NECK W OR WO CONTRAST; Future  2. Primary hypertension Continue antihypertensive medications as already ordered, these medications have been reviewed and there are no changes at this time.  3. Pure hypercholesterolemia Continue statin as ordered and reviewed, no changes at this time   Current Outpatient Medications on File Prior to Visit  Medication Sig Dispense Refill   acetaminophen (TYLENOL) 500 MG tablet Take 500 mg by mouth every 8 (eight) hours as needed.     amLODipine (NORVASC) 2.5 MG tablet Take 2.5 mg by mouth daily.     clopidogrel  (PLAVIX ) 75 MG tablet Take 75 mg by mouth daily.     escitalopram (LEXAPRO) 10 MG tablet Take 1 tablet by mouth daily.     gabapentin (NEURONTIN) 100 MG tablet Take 200 mg by mouth at bedtime.     levothyroxine (SYNTHROID) 100 MCG tablet Take 100 mcg by mouth daily.     metoprolol tartrate (LOPRESSOR) 50 MG tablet Take 50 mg by mouth daily.     Multiple Vitamins-Minerals (PRESERVISION AREDS 2) CAPS Take 2 capsules by mouth daily  at 12 noon.     pravastatin (PRAVACHOL) 40 MG tablet Take 40 mg by mouth daily.     valsartan (DIOVAN) 40 MG tablet Take 80 mg by mouth daily.     No current facility-administered medications on file prior to visit.    There are no Patient Instructions on file for this visit. No follow-ups on file.   Sebrina Kessner E Makensie Mulhall, NP

## 2024-03-07 ENCOUNTER — Telehealth (INDEPENDENT_AMBULATORY_CARE_PROVIDER_SITE_OTHER): Payer: Self-pay | Admitting: Vascular Surgery

## 2024-03-07 NOTE — Telephone Encounter (Signed)
 Spoke with pt to advise the CT has an authorization (Medicare A/B) and gave number to CT scheduling ([phone]). I asked pt to call and schedule the CT and then call us  back at the office to make a CT results appt with Dr. Vonna Guardian. Pt acknowledged.

## 2024-03-11 ENCOUNTER — Encounter (INDEPENDENT_AMBULATORY_CARE_PROVIDER_SITE_OTHER): Payer: Self-pay

## 2024-03-18 ENCOUNTER — Ambulatory Visit
Admission: RE | Admit: 2024-03-18 | Discharge: 2024-03-18 | Disposition: A | Discharge status: Home / Self Care | Source: Ambulatory Visit | Attending: Nurse Practitioner | Admitting: Nurse Practitioner

## 2024-03-18 DIAGNOSIS — I6523 Occlusion and stenosis of bilateral carotid arteries: Secondary | ICD-10-CM | POA: Insufficient documentation

## 2024-03-18 LAB — POCT I-STAT CREATININE: Creatinine, Ser: 1.1 mg/dL — ABNORMAL HIGH (ref 0.44–1.00)

## 2024-03-18 MED ORDER — IOHEXOL 350 MG/ML SOLN
75.0000 mL | Freq: Once | INTRAVENOUS | Status: AC | PRN
  Administered 2024-03-18: 75 mL via INTRAVENOUS

## 2024-03-25 ENCOUNTER — Encounter: Payer: Self-pay | Admitting: Nurse Practitioner

## 2024-03-26 ENCOUNTER — Encounter: Admitting: Student

## 2024-03-28 ENCOUNTER — Ambulatory Visit (INDEPENDENT_AMBULATORY_CARE_PROVIDER_SITE_OTHER): Admitting: Nurse Practitioner

## 2024-03-28 ENCOUNTER — Encounter (INDEPENDENT_AMBULATORY_CARE_PROVIDER_SITE_OTHER): Payer: Self-pay | Admitting: Nurse Practitioner

## 2024-03-28 ENCOUNTER — Ambulatory Visit (INDEPENDENT_AMBULATORY_CARE_PROVIDER_SITE_OTHER): Admitting: Vascular Surgery

## 2024-03-28 VITALS — BP 130/65 | HR 54 | Resp 16 | Wt 118.2 lb

## 2024-03-28 DIAGNOSIS — I6523 Occlusion and stenosis of bilateral carotid arteries: Secondary | ICD-10-CM | POA: Diagnosis not present

## 2024-03-28 DIAGNOSIS — E7800 Pure hypercholesterolemia, unspecified: Secondary | ICD-10-CM | POA: Diagnosis not present

## 2024-03-28 DIAGNOSIS — I10 Essential (primary) hypertension: Secondary | ICD-10-CM | POA: Diagnosis not present

## 2024-03-28 NOTE — H&P (View-Only) (Signed)
 Subjective:    Patient ID: Tanya Larson, female    DOB: 06-Jan-1933, 89 y.o.   MRN: [id number] Chief Complaint  Patient presents with   Follow-up    Ct results    The patient is seen for follow up evaluation of carotid stenosis status post CT angiogram. CT scan was done 03/18/2024.  Patient reports that the test went well with no problems or complications.   The patient denies interval amaurosis fugax. There is no recent or interval TIA symptoms or focal motor deficits. There is no prior documented CVA.  The patient is taking clopidogrel  daily  There is no history of migraine headaches. There is no history of seizures.  No recent shortening of the patient's walking distance or new symptoms consistent with claudication.  No history of rest pain symptoms. No new ulcers or wounds of the lower extremities have occurred.  There is no history of DVT, PE or superficial thrombophlebitis. No recent episodes of angina or shortness of breath documented.   Unfortunately, the CT scan has not been formally read by radiology however CT angiogram is reviewed by me personally, as well as Dr. Vonna Guardian, and it shows a greater than 95%, likely string sign in her right internal carotid artery.    Review of Systems  Cardiovascular:  Negative for leg swelling.  Musculoskeletal:  Positive for arthralgias.  Neurological:  Negative for weakness.  All other systems reviewed and are negative.      Objective:    Physical Exam Vitals reviewed.  HENT:     Head: Normocephalic.  Cardiovascular:     Rate and Rhythm: Normal rate.     Pulses: Normal pulses.  Pulmonary:     Effort: Pulmonary effort is normal.  Skin:    General: Skin is warm and dry.  Neurological:     Mental Status: She is alert and oriented to person, place, and time.  Psychiatric:        Mood and Affect: Mood normal.        Behavior: Behavior normal.        Thought Content: Thought content normal.        Judgment: Judgment normal.      BP 130/65   Pulse (!) 54   Resp 16   Wt 118 lb 3.2 oz (53.6 kg)   BMI 19.08 kg/m   Past Medical History:  Diagnosis Date   Aortic atherosclerosis (HCC)    Per New Patient Packet   Aortic stenosis, moderate    Per New Patient Packet   Arthritis    Per New Patient Packet   Bilateral carotid artery stenosis    Per New Patient Packet   Depressive disorder    Per New Patient Packet   GERD (gastroesophageal reflux disease)    Per New Patient Packet   H/O bone density study    Prior to 2019, Per New Patient Packet   History of colonoscopy    Prior to 2019, Per New Patient Packet   History of mammogram    Prior to 2019, Per New Patient Packet   History of Papanicolaou smear of cervix    Prior to 2019, Per New Patient Packet   Hypertension    Hypothyroidism    Per New Patient Packet   Senile osteopenia    Per New Patient Packet   Stage 3 chronic kidney disease [facility])    Per New Patient Packet    Social History   Socioeconomic History   Marital status: Widowed  Spouse name: Not on file   Number of children: Not on file   Years of education: Not on file   Highest education level: Associate degree: occupational, Scientist, product/process development, or vocational program  Occupational History   Not on file  Tobacco Use   Smoking status: Former    Current packs/day: 0.00    Types: Cigarettes    Quit date: 17    Years since quitting: 42.4   Smokeless tobacco: Never  Vaping Use   Vaping status: Never Used  Substance and Sexual Activity   Alcohol use: Yes    Comment: social drinker   Drug use: Never   Sexual activity: Not on file  Other Topics Concern   Not on file  Social History Narrative   Not on file   Social Drivers of Health   Financial Resource Strain: Low Risk  (12/25/2023)   Received from [facility]   Overall Financial Resource Strain (CARDIA)    Difficulty of Paying Living Expenses: Not hard at all  Food Insecurity: No Food Insecurity (12/25/2023)    Received from [facility]   Hunger Vital Sign    Worried About Running Out of Food in the Last Year: Never true    Ran Out of Food in the Last Year: Never true  Transportation Needs: No Transportation Needs (12/25/2023)   Received from [facility] - Transportation    In the past 12 months, has lack of transportation kept you from medical appointments or from getting medications?: No    Lack of Transportation (Non-Medical): No  Physical Activity: Sufficiently Active (05/25/2023)   Exercise Vital Sign    Days of Exercise per Week: 7 days    Minutes of Exercise per Session: 30 min  Stress: No Stress Concern Present (05/25/2023)   [employer] of Occupational Health - Occupational Stress Questionnaire    Feeling of Stress : Only a little  Social Connections: Unknown (05/25/2023)   Social Connection and Isolation Panel [NHANES]    Frequency of Communication with Friends and Family: More than three times a week    Frequency of Social Gatherings with Friends and Family: Twice a week    Attends Religious Services: More than 4 times per year    Active Member of [employer] or Organizations: Not on file    Attends Banker Meetings: Not on file    Marital Status: Widowed  Intimate Partner Violence: Not on file    Past Surgical History:  Procedure Laterality Date   ANKLE SURGERY Right 2019   APPENDECTOMY     arm surgery Right    EYE SURGERY      Family History  Problem Relation Age of Onset   Aortic stenosis Mother    Dementia Father    Congestive Heart Failure Brother    Kidney disease Brother    Suicidality Brother     Allergies  Allergen Reactions   Erythromycin Hives    Mouth breaks out       Latest Ref Rng & Units 05/04/2022   12:02 PM  CBC  WBC 4.0 - 10.5 K/uL 10.1   Hemoglobin 12.0 - 15.0 g/dL 78.2   Hematocrit 95.6 - 46.0 % 43.0   Platelets 150 - 400 K/uL 211        CMP     Component Value Date/Time   NA  135 05/04/2022 1202   K 5.0 05/04/2022 1202   CL 105 05/04/2022 1202   CO2  22 05/04/2022 1202   GLUCOSE 109 (H) 05/04/2022 1202   BUN 12 05/04/2022 1202   CREATININE 1.10 (H) 03/18/2024 1507   CALCIUM 9.2 05/04/2022 1202   GFRNONAA 43 (L) 05/04/2022 1202     No results found.     Assessment & Plan:   1. Bilateral carotid artery stenosis (Primary) Recommend:  The patient is symptomatic with respect to the carotid stenosis.  The patient now has progressed and has a lesion the is >70%.  I have completed Shared Decision-Making with Laverne Hursey to carotid surgery/intervention. The conversation included: -Discussion of all treatment options including carotid endarterectomy (CEA), carotid artery stenting (which includes transcarotid artery revascularization (TCAR)), and optimal medical therapy (OMT). -Explanation of risks and benefits for each option specific to [facility]'s clinical situation. -Integration of clinical guidelines as it relates to the patient's history and comorbidities. -Discussion and incorporation of Harmonii Karle and their personal preferences and priorities in choosing a treatment plan plan  Patient's CT angiography of the carotid arteries confirms >70% right ICA stenosis.  The anatomical considerations support stenting over surgery.  This was discussed in detail with the patient.  The risks, benefits and alternative therapies were reviewed in detail with the patient.  All questions were answered.  The patient agrees to proceed with stenting of the right carotid artery.  The patient's NIHSS score is as follows: 0 Mild: 1 - 5 Mild to Moderately Severe: 5 - 14 Severe: 15 - 24 Very Severe: >25  Continue antiplatelet therapy as prescribed. Continue management of CAD, HTN and Hyperlipidemia. Healthy heart diet, encouraged exercise at least 4 times per week.   2. Primary hypertension Continue antihypertensive medications as already ordered, these  medications have been reviewed and there are no changes at this time.  3. Pure hypercholesterolemia Continue statin as ordered and reviewed, no changes at this time   Current Outpatient Medications on File Prior to Visit  Medication Sig Dispense Refill   acetaminophen (TYLENOL) 500 MG tablet Take 500 mg by mouth every 8 (eight) hours as needed.     amLODipine (NORVASC) 2.5 MG tablet Take 2.5 mg by mouth daily.     clopidogrel  (PLAVIX ) 75 MG tablet Take 75 mg by mouth daily.     escitalopram (LEXAPRO) 10 MG tablet Take 1 tablet by mouth daily.     gabapentin (NEURONTIN) 100 MG tablet Take 200 mg by mouth at bedtime.     levothyroxine (SYNTHROID) 100 MCG tablet Take 100 mcg by mouth daily.     metoprolol tartrate (LOPRESSOR) 50 MG tablet Take 50 mg by mouth daily.     Multiple Vitamins-Minerals (PRESERVISION AREDS 2) CAPS Take 2 capsules by mouth daily at 12 noon.     pravastatin (PRAVACHOL) 40 MG tablet Take 40 mg by mouth daily.     valsartan (DIOVAN) 40 MG tablet Take 80 mg by mouth daily.     No current facility-administered medications on file prior to visit.    There are no Patient Instructions on file for this visit. No follow-ups on file.   Criss Pallone E Kymberli Wiegand, NP

## 2024-03-28 NOTE — Progress Notes (Signed)
 Subjective:    Patient ID: Tanya Larson, female    DOB: 06-Jan-1933, 89 y.o.   MRN: [id number] Chief Complaint  Patient presents with   Follow-up    Ct results    The patient is seen for follow up evaluation of carotid stenosis status post CT angiogram. CT scan was done 03/18/2024.  Patient reports that the test went well with no problems or complications.   The patient denies interval amaurosis fugax. There is no recent or interval TIA symptoms or focal motor deficits. There is no prior documented CVA.  The patient is taking clopidogrel  daily  There is no history of migraine headaches. There is no history of seizures.  No recent shortening of the patient's walking distance or new symptoms consistent with claudication.  No history of rest pain symptoms. No new ulcers or wounds of the lower extremities have occurred.  There is no history of DVT, PE or superficial thrombophlebitis. No recent episodes of angina or shortness of breath documented.   Unfortunately, the CT scan has not been formally read by radiology however CT angiogram is reviewed by me personally, as well as Dr. Vonna Guardian, and it shows a greater than 95%, likely string sign in her right internal carotid artery.    Review of Systems  Cardiovascular:  Negative for leg swelling.  Musculoskeletal:  Positive for arthralgias.  Neurological:  Negative for weakness.  All other systems reviewed and are negative.      Objective:    Physical Exam Vitals reviewed.  HENT:     Head: Normocephalic.  Cardiovascular:     Rate and Rhythm: Normal rate.     Pulses: Normal pulses.  Pulmonary:     Effort: Pulmonary effort is normal.  Skin:    General: Skin is warm and dry.  Neurological:     Mental Status: She is alert and oriented to person, place, and time.  Psychiatric:        Mood and Affect: Mood normal.        Behavior: Behavior normal.        Thought Content: Thought content normal.        Judgment: Judgment normal.      BP 130/65   Pulse (!) 54   Resp 16   Wt 118 lb 3.2 oz (53.6 kg)   BMI 19.08 kg/m   Past Medical History:  Diagnosis Date   Aortic atherosclerosis (HCC)    Per New Patient Packet   Aortic stenosis, moderate    Per New Patient Packet   Arthritis    Per New Patient Packet   Bilateral carotid artery stenosis    Per New Patient Packet   Depressive disorder    Per New Patient Packet   GERD (gastroesophageal reflux disease)    Per New Patient Packet   H/O bone density study    Prior to 2019, Per New Patient Packet   History of colonoscopy    Prior to 2019, Per New Patient Packet   History of mammogram    Prior to 2019, Per New Patient Packet   History of Papanicolaou smear of cervix    Prior to 2019, Per New Patient Packet   Hypertension    Hypothyroidism    Per New Patient Packet   Senile osteopenia    Per New Patient Packet   Stage 3 chronic kidney disease [facility])    Per New Patient Packet    Social History   Socioeconomic History   Marital status: Widowed  Spouse name: Not on file   Number of children: Not on file   Years of education: Not on file   Highest education level: Associate degree: occupational, Scientist, product/process development, or vocational program  Occupational History   Not on file  Tobacco Use   Smoking status: Former    Current packs/day: 0.00    Types: Cigarettes    Quit date: 17    Years since quitting: 42.4   Smokeless tobacco: Never  Vaping Use   Vaping status: Never Used  Substance and Sexual Activity   Alcohol use: Yes    Comment: social drinker   Drug use: Never   Sexual activity: Not on file  Other Topics Concern   Not on file  Social History Narrative   Not on file   Social Drivers of Health   Financial Resource Strain: Low Risk  (12/25/2023)   Received from [facility]   Overall Financial Resource Strain (CARDIA)    Difficulty of Paying Living Expenses: Not hard at all  Food Insecurity: No Food Insecurity (12/25/2023)    Received from [facility]   Hunger Vital Sign    Worried About Running Out of Food in the Last Year: Never true    Ran Out of Food in the Last Year: Never true  Transportation Needs: No Transportation Needs (12/25/2023)   Received from [facility] - Transportation    In the past 12 months, has lack of transportation kept you from medical appointments or from getting medications?: No    Lack of Transportation (Non-Medical): No  Physical Activity: Sufficiently Active (05/25/2023)   Exercise Vital Sign    Days of Exercise per Week: 7 days    Minutes of Exercise per Session: 30 min  Stress: No Stress Concern Present (05/25/2023)   [employer] of Occupational Health - Occupational Stress Questionnaire    Feeling of Stress : Only a little  Social Connections: Unknown (05/25/2023)   Social Connection and Isolation Panel [NHANES]    Frequency of Communication with Friends and Family: More than three times a week    Frequency of Social Gatherings with Friends and Family: Twice a week    Attends Religious Services: More than 4 times per year    Active Member of [employer] or Organizations: Not on file    Attends Banker Meetings: Not on file    Marital Status: Widowed  Intimate Partner Violence: Not on file    Past Surgical History:  Procedure Laterality Date   ANKLE SURGERY Right 2019   APPENDECTOMY     arm surgery Right    EYE SURGERY      Family History  Problem Relation Age of Onset   Aortic stenosis Mother    Dementia Father    Congestive Heart Failure Brother    Kidney disease Brother    Suicidality Brother     Allergies  Allergen Reactions   Erythromycin Hives    Mouth breaks out       Latest Ref Rng & Units 05/04/2022   12:02 PM  CBC  WBC 4.0 - 10.5 K/uL 10.1   Hemoglobin 12.0 - 15.0 g/dL 78.2   Hematocrit 95.6 - 46.0 % 43.0   Platelets 150 - 400 K/uL 211        CMP     Component Value Date/Time   NA  135 05/04/2022 1202   K 5.0 05/04/2022 1202   CL 105 05/04/2022 1202   CO2  22 05/04/2022 1202   GLUCOSE 109 (H) 05/04/2022 1202   BUN 12 05/04/2022 1202   CREATININE 1.10 (H) 03/18/2024 1507   CALCIUM 9.2 05/04/2022 1202   GFRNONAA 43 (L) 05/04/2022 1202     No results found.     Assessment & Plan:   1. Bilateral carotid artery stenosis (Primary) Recommend:  The patient is symptomatic with respect to the carotid stenosis.  The patient now has progressed and has a lesion the is >70%.  I have completed Shared Decision-Making with Laverne Hursey to carotid surgery/intervention. The conversation included: -Discussion of all treatment options including carotid endarterectomy (CEA), carotid artery stenting (which includes transcarotid artery revascularization (TCAR)), and optimal medical therapy (OMT). -Explanation of risks and benefits for each option specific to [facility]'s clinical situation. -Integration of clinical guidelines as it relates to the patient's history and comorbidities. -Discussion and incorporation of Harmonii Karle and their personal preferences and priorities in choosing a treatment plan plan  Patient's CT angiography of the carotid arteries confirms >70% right ICA stenosis.  The anatomical considerations support stenting over surgery.  This was discussed in detail with the patient.  The risks, benefits and alternative therapies were reviewed in detail with the patient.  All questions were answered.  The patient agrees to proceed with stenting of the right carotid artery.  The patient's NIHSS score is as follows: 0 Mild: 1 - 5 Mild to Moderately Severe: 5 - 14 Severe: 15 - 24 Very Severe: >25  Continue antiplatelet therapy as prescribed. Continue management of CAD, HTN and Hyperlipidemia. Healthy heart diet, encouraged exercise at least 4 times per week.   2. Primary hypertension Continue antihypertensive medications as already ordered, these  medications have been reviewed and there are no changes at this time.  3. Pure hypercholesterolemia Continue statin as ordered and reviewed, no changes at this time   Current Outpatient Medications on File Prior to Visit  Medication Sig Dispense Refill   acetaminophen (TYLENOL) 500 MG tablet Take 500 mg by mouth every 8 (eight) hours as needed.     amLODipine (NORVASC) 2.5 MG tablet Take 2.5 mg by mouth daily.     clopidogrel  (PLAVIX ) 75 MG tablet Take 75 mg by mouth daily.     escitalopram (LEXAPRO) 10 MG tablet Take 1 tablet by mouth daily.     gabapentin (NEURONTIN) 100 MG tablet Take 200 mg by mouth at bedtime.     levothyroxine (SYNTHROID) 100 MCG tablet Take 100 mcg by mouth daily.     metoprolol tartrate (LOPRESSOR) 50 MG tablet Take 50 mg by mouth daily.     Multiple Vitamins-Minerals (PRESERVISION AREDS 2) CAPS Take 2 capsules by mouth daily at 12 noon.     pravastatin (PRAVACHOL) 40 MG tablet Take 40 mg by mouth daily.     valsartan (DIOVAN) 40 MG tablet Take 80 mg by mouth daily.     No current facility-administered medications on file prior to visit.    There are no Patient Instructions on file for this visit. No follow-ups on file.   Criss Pallone E Kymberli Wiegand, NP

## 2024-04-01 ENCOUNTER — Telehealth (INDEPENDENT_AMBULATORY_CARE_PROVIDER_SITE_OTHER): Payer: Self-pay

## 2024-04-01 NOTE — Telephone Encounter (Addendum)
 I attempted to contact the patient to schedule her for a left carotid stent with Dr. Vonna Guardian. A message was left on her home and mobile voicemail boxes for a return call. Patient called  back and is scheduled with Dr. Vonna Guardian on 04/14/24 with a 10:00 am arrival time to the [facility]. Pre-procedure instructions were discussed and will be sent to Mychart and mailed.

## 2024-04-02 ENCOUNTER — Encounter: Payer: Self-pay | Admitting: Student

## 2024-04-02 ENCOUNTER — Ambulatory Visit: Admitting: Student

## 2024-04-02 VITALS — BP 138/76 | HR 59 | Temp 97.4°F | Ht 66.0 in | Wt 117.8 lb

## 2024-04-02 DIAGNOSIS — G44219 Episodic tension-type headache, not intractable: Secondary | ICD-10-CM | POA: Diagnosis not present

## 2024-04-02 DIAGNOSIS — M25551 Pain in right hip: Secondary | ICD-10-CM

## 2024-04-02 DIAGNOSIS — Z7189 Other specified counseling: Secondary | ICD-10-CM | POA: Diagnosis not present

## 2024-04-02 DIAGNOSIS — N1831 Chronic kidney disease, stage 3a: Secondary | ICD-10-CM

## 2024-04-02 DIAGNOSIS — Z66 Do not resuscitate: Secondary | ICD-10-CM

## 2024-04-02 DIAGNOSIS — I350 Nonrheumatic aortic (valve) stenosis: Secondary | ICD-10-CM

## 2024-04-02 DIAGNOSIS — I6523 Occlusion and stenosis of bilateral carotid arteries: Secondary | ICD-10-CM

## 2024-04-02 NOTE — Patient Instructions (Addendum)
 Please get your hip x-ray completed at your soonest convenience below is the contact information and location.   Address: [facility], [address] [place], [place], [place] [address] Phone: [phone]  VISIT SUMMARY:  Today, we discussed your recent headaches and hip pain. You mentioned that your headaches started yesterday and are likely due to stress from your upcoming carotid surgery and property sale. Your right hip pain has been persistent, and we reviewed your current nutrition and weight loss. We also talked about your goals of care and advanced directives.  YOUR PLAN:  -HIP PAIN: Chronic right hip pain can be due to various reasons, including arthritis or injury. We will get an x-ray of your right hip and refer you to Dr. Ernesta Heading at [facility] for further evaluation and possible surgical intervention after your carotid surgery.  -HEADACHE: Your headaches seem to be tension headaches, likely caused by stress. Tension headaches are usually felt as a band of pressure around the head. You should take two capsules of acetaminophen every eight hours as needed for the headache. If the headache does not improve after one hour, please contact the clinic.  -WEIGHT LOSS: You have lost a small amount of weight, which may be due to insufficient protein intake. To prevent further weight loss, you should aim to increase your protein intake to 30 grams per meal. This can help maintain your weight and overall health.  -GOALS OF CARE: You have expressed a desire not to be resuscitated in the event of cardiac arrest, and you have advanced directives in place. Your children will continue to check in on you daily to ensure your well-being.  INSTRUCTIONS:  Please follow up with the orthopedic specialist, Dr. Ernesta Heading, after your carotid surgery. If your headache does not improve after taking acetaminophen, contact the clinic. Continue to focus on increasing your protein intake to  30 grams per meal.

## 2024-04-02 NOTE — Progress Notes (Signed)
 [facility] Provider: Jann Melody  Code Status: DNR Goals of Care:     02/22/2024    8:59 AM  Advanced Directives  Does Patient Have a Medical Advance Directive? Yes  Type of Advance Directive Out of facility DNR (pink MOST or yellow form);Living will  Does patient want to make changes to medical advance directive? No - Patient declined     Chief Complaint  Patient presents with   Medical Management of Chronic Issues    Medical Management of Chronic Issues. 1 Month follow up    HPI: Patient is a 89 y.o. female seen today for medical management of chronic diseases.   Discussed the use of AI scribe software for clinical note transcription with the patient, who gave verbal consent to proceed.  History of Present Illness   Tanya Larson is an 89 year old female who presents with headaches and hip pain.  She has been experiencing headaches since yesterday, described as a sensation 'laying right across the front of my head.' The headaches are intermittent and do not disturb her sleep. She attributes them to stress related to her upcoming carotid surgery and the sale of her property in [place] . She has a history of occasional headaches and has taken half of an extra strength Tylenol capsule for relief, which she acknowledges is not a full treatment dose. She also takes gabapentin, which aids her sleep. No weakness, dizziness, or changes in vision associated with her headaches.  She reports persistent right hip pain, for which she has been taking Tylenol and gabapentin. She mentions a previous x-ray of her left hip but clarifies that her right hip is the one causing trouble.  Her nutrition includes a protein shake in the morning and a salad or mozzarella balls with tomatoes for lunch. She notes a slight decrease in appetite and a small weight loss over the winter, which she attributes to eating more vegetables in the summer. She drinks six to eight glasses  of water daily.  She is scheduled for carotid surgery on the 23rd and expresses some stress about the procedure. She has a supportive family, with her daughter assisting her with tasks such as signing documents electronically. She has two children who check on her daily via phone calls.         Past Medical History:  Diagnosis Date   Aortic atherosclerosis [facility])    Per New Patient Packet   Aortic stenosis, moderate    Per New Patient Packet   Arthritis    Per New Patient Packet   Bilateral carotid artery stenosis    Per New Patient Packet   Depressive disorder    Per New Patient Packet   GERD (gastroesophageal reflux disease)    Per New Patient Packet   H/O bone density study    Prior to 2019, Per New Patient Packet   History of colonoscopy    Prior to 2019, Per New Patient Packet   History of mammogram    Prior to 2019, Per New Patient Packet   History of Papanicolaou smear of cervix    Prior to 2019, Per New Patient Packet   Hypertension    Hypothyroidism    Per New Patient Packet   Senile osteopenia    Per New Patient Packet   Stage 3 chronic kidney disease [facility])    Per New Patient Packet    Past Surgical History:  Procedure Laterality Date   ANKLE SURGERY Right 2019  APPENDECTOMY     arm surgery Right    EYE SURGERY      Allergies  Allergen Reactions   Erythromycin Hives    Mouth breaks out    Outpatient Encounter Medications as of 04/02/2024  Medication Sig   acetaminophen (TYLENOL) 500 MG tablet Take 500 mg by mouth every 8 (eight) hours as needed.   amLODipine (NORVASC) 2.5 MG tablet Take 2.5 mg by mouth daily.   clopidogrel  (PLAVIX ) 75 MG tablet Take 75 mg by mouth daily.   escitalopram (LEXAPRO) 10 MG tablet Take 1 tablet by mouth daily.   gabapentin (NEURONTIN) 100 MG tablet Take 200 mg by mouth at bedtime.   levothyroxine (SYNTHROID) 100 MCG tablet Take 100 mcg by mouth daily.   metoprolol tartrate (LOPRESSOR) 50 MG tablet Take 50 mg by mouth  daily.   Multiple Vitamins-Minerals (PRESERVISION AREDS 2) CAPS Take 2 capsules by mouth daily at 12 noon.   pravastatin (PRAVACHOL) 40 MG tablet Take 40 mg by mouth daily.   valsartan (DIOVAN) 40 MG tablet Take 80 mg by mouth daily.   No facility-administered encounter medications on file as of 04/02/2024.    Review of Systems:  Review of Systems  Health Maintenance  Topic Date Due   Medicare Annual Wellness (AWV)  Never done   Zoster Vaccines- Shingrix (1 of 2) 10/16/1985   DEXA SCAN  Never done   DTaP/Tdap/Td (2 - Tdap) 12/31/2022   INFLUENZA VACCINE  05/23/2024   COVID-19 Vaccine (8 - 2024-25 season) 08/02/2024   Pneumonia Vaccine 69+ Years old  Completed   HPV VACCINES  Aged Out   Meningococcal B Vaccine  Aged Out    Physical Exam: Vitals:   04/02/24 1545  BP: 138/76  Pulse: (!) 59  Temp: (!) 97.4 F (36.3 C)  SpO2: 95%  Weight: 117 lb 12.8 oz (53.4 kg)  Height: 5' 6 (1.676 m)   Body mass index is 19.01 kg/m. Physical Exam Constitutional:      Appearance: Normal appearance.  Cardiovascular:     Rate and Rhythm: Normal rate and regular rhythm.     Pulses: Normal pulses.     Heart sounds: Normal heart sounds.  Pulmonary:     Effort: Pulmonary effort is normal.  Abdominal:     General: Abdomen is flat. Bowel sounds are normal.     Palpations: Abdomen is soft.  Musculoskeletal:        General: No swelling or tenderness.  Skin:    General: Skin is warm and dry.  Neurological:     Mental Status: She is alert.     Gait: Gait normal.  Psychiatric:        Mood and Affect: Mood normal.    Physical Exam   VITALS: P- 50 MEASUREMENTS: Weight- 117.      Labs reviewed: Basic Metabolic Panel: Recent Labs    03/18/24 1507  CREATININE 1.10*   Liver Function Tests: No results for input(s): AST, ALT, ALKPHOS, BILITOT, PROT, ALBUMIN in the last 8760 hours. No results for input(s): LIPASE, AMYLASE in the last 8760 hours. No results for  input(s): AMMONIA in the last 8760 hours. CBC: No results for input(s): WBC, NEUTROABS, HGB, HCT, MCV, PLT in the last 8760 hours. Lipid Panel: No results for input(s): CHOL, HDL, LDLCALC, TRIG, CHOLHDL, LDLDIRECT in the last 8760 hours. No results found for: HGBA1C  Procedures since last visit: No results found. Results   RADIOLOGY Left Hip X-ray: Left hip imaging (01/2024)  Assessment/Plan     Hip pain Chronic right hip pain. Previous x-rays were conducted on the left hip, but the right hip is the source of pain. No prior x-rays for the right hip are available. Referral to orthopedics for further evaluation and potential surgical intervention in four months post carotid surgery. - Order right hip x-ray - Refer to [facility], Dr. Ernesta Heading for orthopedic evaluation  Headache Recent onset of tension headache across the frontal region, stress-related. No associated symptoms such as weakness, dizziness, or visual changes. Headache improved with rest and gabapentin. Inadequate dosing of acetaminophen noted; advised to increase dose for effective pain management. - Advise taking two capsules of acetaminophen every eight hours as needed for headache - Contact clinic if headache does not improve after one hour  Weight loss Patient reports losing a pound since last seen and a little bit of weight this winter, expecting improvement with increased vegetable intake in summer. Dietary intake includes protein shakes and salads, but protein intake may be insufficient. Emphasis on increasing protein intake to 30 grams per meal to prevent further weight loss. - Provide dietary instructions to increase protein intake to 30 grams per meal  Goals of Care She has expressed a desire not to be resuscitated in the event of cardiac arrest. Advanced directives are in place and stored in her refrigerator. Daily check-in calls are set up by her children to ensure her  well-being.   Artery stenosis Surgery scheduled on 6/23.   CKD 3 Avoid nephrotoxic agents.   Aortic Stenosis Audible murmur. No signs of volume changes at this time.      Labs/tests ordered:  * No order type specified * Next appt:  06/06/2024

## 2024-04-14 ENCOUNTER — Other Ambulatory Visit: Payer: Self-pay

## 2024-04-14 ENCOUNTER — Encounter: Payer: Self-pay | Admitting: Vascular Surgery

## 2024-04-14 ENCOUNTER — Inpatient Hospital Stay
Admission: RE | Admit: 2024-04-14 | Discharge: 2024-04-15 | DRG: 036 | Disposition: A | Discharge status: Home / Self Care | Attending: Vascular Surgery | Admitting: Vascular Surgery

## 2024-04-14 ENCOUNTER — Encounter
Admission: RE | Disposition: A | Discharge status: Home / Self Care | Payer: Self-pay | Source: Home / Self Care | Attending: Vascular Surgery

## 2024-04-14 DIAGNOSIS — K219 Gastro-esophageal reflux disease without esophagitis: Secondary | ICD-10-CM | POA: Diagnosis present

## 2024-04-14 DIAGNOSIS — M8580 Other specified disorders of bone density and structure, unspecified site: Secondary | ICD-10-CM | POA: Diagnosis present

## 2024-04-14 DIAGNOSIS — I2510 Atherosclerotic heart disease of native coronary artery without angina pectoris: Secondary | ICD-10-CM | POA: Diagnosis present

## 2024-04-14 DIAGNOSIS — N183 Chronic kidney disease, stage 3 unspecified: Secondary | ICD-10-CM | POA: Diagnosis present

## 2024-04-14 DIAGNOSIS — Z7902 Long term (current) use of antithrombotics/antiplatelets: Secondary | ICD-10-CM | POA: Diagnosis not present

## 2024-04-14 DIAGNOSIS — Z79890 Hormone replacement therapy (postmenopausal): Secondary | ICD-10-CM | POA: Diagnosis not present

## 2024-04-14 DIAGNOSIS — Z87891 Personal history of nicotine dependence: Secondary | ICD-10-CM

## 2024-04-14 DIAGNOSIS — I700 Atherosclerosis of aorta: Secondary | ICD-10-CM | POA: Diagnosis present

## 2024-04-14 DIAGNOSIS — E7800 Pure hypercholesterolemia, unspecified: Secondary | ICD-10-CM | POA: Diagnosis present

## 2024-04-14 DIAGNOSIS — Z8249 Family history of ischemic heart disease and other diseases of the circulatory system: Secondary | ICD-10-CM

## 2024-04-14 DIAGNOSIS — I129 Hypertensive chronic kidney disease with stage 1 through stage 4 chronic kidney disease, or unspecified chronic kidney disease: Secondary | ICD-10-CM | POA: Diagnosis present

## 2024-04-14 DIAGNOSIS — I6521 Occlusion and stenosis of right carotid artery: Secondary | ICD-10-CM | POA: Diagnosis present

## 2024-04-14 DIAGNOSIS — F32A Depression, unspecified: Secondary | ICD-10-CM | POA: Diagnosis present

## 2024-04-14 DIAGNOSIS — I350 Nonrheumatic aortic (valve) stenosis: Secondary | ICD-10-CM | POA: Diagnosis present

## 2024-04-14 DIAGNOSIS — I6523 Occlusion and stenosis of bilateral carotid arteries: Secondary | ICD-10-CM | POA: Diagnosis present

## 2024-04-14 DIAGNOSIS — Z841 Family history of disorders of kidney and ureter: Secondary | ICD-10-CM | POA: Diagnosis not present

## 2024-04-14 DIAGNOSIS — Z79899 Other long term (current) drug therapy: Secondary | ICD-10-CM

## 2024-04-14 DIAGNOSIS — E039 Hypothyroidism, unspecified: Secondary | ICD-10-CM | POA: Diagnosis present

## 2024-04-14 HISTORY — PX: CAROTID PTA/STENT INTERVENTION: CATH118231

## 2024-04-14 LAB — CREATININE, SERUM
Creatinine, Ser: 1.06 mg/dL — ABNORMAL HIGH (ref 0.44–1.00)
GFR, Estimated: 51 mL/min — ABNORMAL LOW (ref >60)

## 2024-04-14 LAB — CBC
HCT: 41.4 % (ref 36.0–46.0)
Hemoglobin: 13 g/dL (ref 12.0–15.0)
MCH: 30.7 pg (ref 26.0–34.0)
MCHC: 31.4 g/dL (ref 30.0–36.0)
MCV: 97.9 fL (ref 80.0–100.0)
Platelets: 176 10*3/uL (ref 150–400)
RBC: 4.23 MIL/uL (ref 3.87–5.11)
RDW: 12.5 % (ref 11.5–15.5)
WBC: 5.4 10*3/uL (ref 4.0–10.5)
nRBC: 0 % (ref 0.0–0.2)

## 2024-04-14 LAB — GLUCOSE, CAPILLARY: Glucose-Capillary: 83 mg/dL (ref 70–99)

## 2024-04-14 LAB — MRSA NEXT GEN BY PCR, NASAL: MRSA by PCR Next Gen: NOT DETECTED

## 2024-04-14 LAB — BUN: BUN: 23 mg/dL (ref 8–23)

## 2024-04-14 SURGERY — CAROTID PTA/STENT INTERVENTION
Anesthesia: Moderate Sedation | Laterality: Right

## 2024-04-14 MED ORDER — SODIUM CHLORIDE 0.9 % IV SOLN
INTRAVENOUS | Status: DC
Start: 2024-04-14 — End: 2024-04-14
  Administered 2024-04-14: 500 mL via INTRAVENOUS

## 2024-04-14 MED ORDER — GABAPENTIN 100 MG PO CAPS
100.0000 mg | ORAL_CAPSULE | Freq: Every day | ORAL | Status: DC
  Administered 2024-04-14: 100 mg via ORAL
  Filled 2024-04-14: qty 1

## 2024-04-14 MED ORDER — ATROPINE SULFATE 1 MG/10ML IJ SOSY
PREFILLED_SYRINGE | INTRAMUSCULAR | Status: AC
  Filled 2024-04-14: qty 20

## 2024-04-14 MED ORDER — HEPARIN SODIUM (PORCINE) 1000 UNIT/ML IJ SOLN
INTRAMUSCULAR | Status: AC
  Filled 2024-04-14: qty 10

## 2024-04-14 MED ORDER — DIPHENHYDRAMINE HCL 50 MG/ML IJ SOLN: 50.0000 mg | Freq: Once | INTRAMUSCULAR | Status: DC | PRN

## 2024-04-14 MED ORDER — ASPIRIN 81 MG PO TBEC
81.0000 mg | DELAYED_RELEASE_TABLET | Freq: Every day | ORAL | Status: DC
Start: 2024-04-15 — End: 2024-04-15
  Administered 2024-04-15: 81 mg via ORAL
  Filled 2024-04-14: qty 1

## 2024-04-14 MED ORDER — SODIUM CHLORIDE 0.9 % IV SOLN
500.0000 mL | Freq: Once | INTRAVENOUS | Status: DC | PRN
Start: 2024-04-14 — End: 2024-04-15

## 2024-04-14 MED ORDER — ACETAMINOPHEN 500 MG PO TABS: 500.0000 mg | ORAL_TABLET | Freq: Three times a day (TID) | ORAL | Status: DC | PRN

## 2024-04-14 MED ORDER — MIDAZOLAM HCL 2 MG/ML PO SYRP: 8.0000 mg | ORAL_SOLUTION | Freq: Once | ORAL | Status: DC | PRN

## 2024-04-14 MED ORDER — MIDAZOLAM HCL 2 MG/2ML IJ SOLN
INTRAMUSCULAR | Status: DC | PRN
  Administered 2024-04-14: 1 mg via INTRAVENOUS

## 2024-04-14 MED ORDER — CEFAZOLIN SODIUM-DEXTROSE 2-4 GM/100ML-% IV SOLN
INTRAVENOUS | Status: AC
  Filled 2024-04-14: qty 100

## 2024-04-14 MED ORDER — IODIXANOL 320 MG/ML IV SOLN
INTRAVENOUS | Status: DC | PRN
  Administered 2024-04-14: 70 mL via INTRA_ARTERIAL

## 2024-04-14 MED ORDER — LEVOTHYROXINE SODIUM 100 MCG PO TABS
100.0000 ug | ORAL_TABLET | Freq: Every day | ORAL | Status: DC
  Administered 2024-04-15: 100 ug via ORAL
  Filled 2024-04-14: qty 1

## 2024-04-14 MED ORDER — CLOPIDOGREL BISULFATE 75 MG PO TABS
75.0000 mg | ORAL_TABLET | Freq: Every day | ORAL | Status: DC
  Administered 2024-04-14 – 2024-04-15 (×2): 75 mg via ORAL
  Filled 2024-04-14 (×2): qty 1

## 2024-04-14 MED ORDER — IRBESARTAN 75 MG PO TABS
75.0000 mg | ORAL_TABLET | Freq: Every day | ORAL | Status: DC
  Filled 2024-04-14: qty 1

## 2024-04-14 MED ORDER — ACETAMINOPHEN 325 MG RE SUPP: 325.0000 mg | RECTAL | Status: DC | PRN

## 2024-04-14 MED ORDER — METOPROLOL TARTRATE 5 MG/5ML IV SOLN: 2.5000 mg | INTRAVENOUS | Status: DC | PRN

## 2024-04-14 MED ORDER — HYDROCODONE-ACETAMINOPHEN 5-325 MG PO TABS
ORAL_TABLET | ORAL | Status: AC
  Filled 2024-04-14: qty 1

## 2024-04-14 MED ORDER — FENTANYL CITRATE (PF) 100 MCG/2ML IJ SOLN
INTRAMUSCULAR | Status: DC | PRN
Start: 2024-04-14 — End: 2024-04-14
  Administered 2024-04-14: 25 ug via INTRAVENOUS

## 2024-04-14 MED ORDER — ONDANSETRON HCL 4 MG/2ML IJ SOLN: 4.0000 mg | Freq: Four times a day (QID) | INTRAMUSCULAR | Status: DC | PRN

## 2024-04-14 MED ORDER — PHENYLEPHRINE HCL-NACL 20-0.9 MG/250ML-% IV SOLN
INTRAVENOUS | Status: AC
  Filled 2024-04-14: qty 250

## 2024-04-14 MED ORDER — FENTANYL CITRATE PF 50 MCG/ML IJ SOSY
PREFILLED_SYRINGE | INTRAMUSCULAR | Status: AC
  Filled 2024-04-14: qty 1

## 2024-04-14 MED ORDER — HYDRALAZINE HCL 20 MG/ML IJ SOLN: 5.0000 mg | INTRAMUSCULAR | Status: DC | PRN

## 2024-04-14 MED ORDER — FENTANYL CITRATE (PF) 100 MCG/2ML IJ SOLN
INTRAMUSCULAR | Status: DC | PRN
  Administered 2024-04-14: 50 ug via INTRAVENOUS

## 2024-04-14 MED ORDER — HYDROMORPHONE HCL 1 MG/ML IJ SOLN: 1.0000 mg | Freq: Once | INTRAMUSCULAR | Status: DC | PRN

## 2024-04-14 MED ORDER — PHENOL 1.4 % MT LIQD: 1.0000 | OROMUCOSAL | Status: DC | PRN

## 2024-04-14 MED ORDER — AMLODIPINE BESYLATE 5 MG PO TABS
2.5000 mg | ORAL_TABLET | Freq: Every day | ORAL | Status: DC
  Administered 2024-04-15: 2.5 mg via ORAL
  Filled 2024-04-14: qty 1

## 2024-04-14 MED ORDER — LABETALOL HCL 5 MG/ML IV SOLN: 10.0000 mg | INTRAVENOUS | Status: DC | PRN

## 2024-04-14 MED ORDER — METHYLPREDNISOLONE SODIUM SUCC 125 MG IJ SOLR: 125.0000 mg | Freq: Once | INTRAMUSCULAR | Status: DC | PRN

## 2024-04-14 MED ORDER — MIDAZOLAM HCL 2 MG/2ML IJ SOLN
INTRAMUSCULAR | Status: AC
Start: 2024-04-14 — End: 2024-04-14
  Filled 2024-04-14: qty 2

## 2024-04-14 MED ORDER — HEPARIN SODIUM (PORCINE) 1000 UNIT/ML IJ SOLN
INTRAMUSCULAR | Status: DC | PRN
  Administered 2024-04-14: 2000 [IU] via INTRAVENOUS
  Administered 2024-04-14: 6000 [IU] via INTRAVENOUS

## 2024-04-14 MED ORDER — HYDROCODONE-ACETAMINOPHEN 5-325 MG PO TABS
1.0000 | ORAL_TABLET | Freq: Once | ORAL | Status: AC
  Administered 2024-04-14: 1 via ORAL

## 2024-04-14 MED ORDER — PRAVASTATIN SODIUM 40 MG PO TABS
40.0000 mg | ORAL_TABLET | Freq: Every day | ORAL | Status: DC
  Administered 2024-04-14: 40 mg via ORAL
  Filled 2024-04-14: qty 1
  Filled 2024-04-14: qty 2
  Filled 2024-04-14: qty 1

## 2024-04-14 MED ORDER — MIDAZOLAM HCL 2 MG/2ML IJ SOLN
INTRAMUSCULAR | Status: DC | PRN
  Administered 2024-04-14: .5 mg via INTRAVENOUS

## 2024-04-14 MED ORDER — ESCITALOPRAM OXALATE 10 MG PO TABS
10.0000 mg | ORAL_TABLET | Freq: Every day | ORAL | Status: DC
  Administered 2024-04-15: 10 mg via ORAL
  Filled 2024-04-14: qty 1

## 2024-04-14 MED ORDER — ACETAMINOPHEN 325 MG PO TABS
325.0000 mg | ORAL_TABLET | ORAL | Status: DC | PRN
  Administered 2024-04-14: 325 mg via ORAL
  Administered 2024-04-15: 650 mg via ORAL
  Filled 2024-04-14: qty 2
  Filled 2024-04-14: qty 1

## 2024-04-14 MED ORDER — CEFAZOLIN SODIUM-DEXTROSE 2-4 GM/100ML-% IV SOLN
2.0000 g | INTRAVENOUS | Status: AC
  Administered 2024-04-14: 2 g via INTRAVENOUS

## 2024-04-14 MED ORDER — OCUVITE-LUTEIN PO CAPS
1.0000 | ORAL_CAPSULE | Freq: Every day | ORAL | Status: DC
  Filled 2024-04-14: qty 1

## 2024-04-14 MED ORDER — SODIUM CHLORIDE FLUSH 0.9 % IV SOLN
INTRAVENOUS | Status: AC
  Filled 2024-04-14: qty 30

## 2024-04-14 MED ORDER — METOPROLOL TARTRATE 50 MG PO TABS
50.0000 mg | ORAL_TABLET | Freq: Every day | ORAL | Status: DC
  Administered 2024-04-15: 50 mg via ORAL
  Filled 2024-04-14: qty 1

## 2024-04-14 MED ORDER — CEFAZOLIN SODIUM-DEXTROSE 2-4 GM/100ML-% IV SOLN
2.0000 g | Freq: Three times a day (TID) | INTRAVENOUS | Status: AC
  Administered 2024-04-14 – 2024-04-15 (×2): 2 g via INTRAVENOUS
  Filled 2024-04-14 (×2): qty 100

## 2024-04-14 MED ORDER — MIDAZOLAM HCL 2 MG/2ML IJ SOLN
INTRAMUSCULAR | Status: DC | PRN
Start: 2024-04-14 — End: 2024-04-14
  Administered 2024-04-14: .5 mg via INTRAVENOUS

## 2024-04-14 MED ORDER — POTASSIUM CHLORIDE CRYS ER 20 MEQ PO TBCR: 40.0000 meq | EXTENDED_RELEASE_TABLET | Freq: Every day | ORAL | Status: DC | PRN

## 2024-04-14 MED ORDER — FAMOTIDINE 20 MG PO TABS: 40.0000 mg | ORAL_TABLET | Freq: Once | ORAL | Status: DC | PRN

## 2024-04-14 MED ORDER — HEPARIN (PORCINE) IN NACL 2000-0.9 UNIT/L-% IV SOLN
INTRAVENOUS | Status: DC | PRN
  Administered 2024-04-14: 1000 mL

## 2024-04-14 MED ORDER — MORPHINE SULFATE (PF) 4 MG/ML IV SOLN: 2.0000 mg | INTRAVENOUS | Status: DC | PRN

## 2024-04-14 MED ORDER — HEPARIN SODIUM (PORCINE) 5000 UNIT/ML IJ SOLN
5000.0000 [IU] | Freq: Three times a day (TID) | INTRAMUSCULAR | Status: DC
  Administered 2024-04-14 – 2024-04-15 (×3): 5000 [IU] via SUBCUTANEOUS
  Filled 2024-04-14 (×3): qty 1

## 2024-04-14 MED ORDER — LIDOCAINE-EPINEPHRINE (PF) 1 %-1:200000 IJ SOLN
INTRAMUSCULAR | Status: DC | PRN
  Administered 2024-04-14: 10 mL

## 2024-04-14 MED ORDER — PHENYLEPHRINE 80 MCG/ML (10ML) SYRINGE FOR IV PUSH (FOR BLOOD PRESSURE SUPPORT)
PREFILLED_SYRINGE | INTRAVENOUS | Status: AC
  Filled 2024-04-14: qty 10

## 2024-04-14 MED ORDER — FENTANYL CITRATE (PF) 100 MCG/2ML IJ SOLN
INTRAMUSCULAR | Status: DC | PRN
  Administered 2024-04-14: 25 ug via INTRAVENOUS

## 2024-04-14 SURGICAL SUPPLY — 18 items
BALLOON VTRAC 4.5X20X135 (BALLOONS) IMPLANT
CATH ANGIO 5F 100CM .035 PIG (CATHETERS) IMPLANT
CATH HEADHUNTER H1 5F 100CM (CATHETERS) IMPLANT
COVER PROBE ULTRASOUND 5X96 (MISCELLANEOUS) IMPLANT
DEVICE EMBOSHIELD NAV6 4.0-7.0 (FILTER) IMPLANT
DEVICE PRESTO INFLATION (MISCELLANEOUS) IMPLANT
DEVICE TORQUE (MISCELLANEOUS) IMPLANT
DEVICE VASC CLSR CELT ART 6 (Vascular Products) IMPLANT
GLIDEWIRE ANGLED SS 035X260CM (WIRE) IMPLANT
KIT CAROTID MANIFOLD (MISCELLANEOUS) IMPLANT
PACK ANGIOGRAPHY (CUSTOM PROCEDURE TRAY) ×1 IMPLANT
SHEATH BRITE TIP 6FRX11 (SHEATH) IMPLANT
SHEATH SHUTTLE SELECT 6F (SHEATH) IMPLANT
STENT XACT CAR 9-7X40X136 (Permanent Stent) IMPLANT
SUT MNCRL AB 4-0 PS2 18 (SUTURE) IMPLANT
SYR MEDRAD MARK 7 150ML (SYRINGE) IMPLANT
WIRE G VAS 035X260 STIFF (WIRE) IMPLANT
WIRE J 3MM .035X145CM (WIRE) IMPLANT

## 2024-04-14 NOTE — Progress Notes (Signed)
 RN notified Dr. Marea pt started to bleed from her right femoral site, RN held manual pressure for 20 minutes and stop. Tape pressure dressing still in place, no hematoma present.

## 2024-04-14 NOTE — Op Note (Signed)
 OPERATIVE NOTE DATE: 04/14/2024  PROCEDURE:  Ultrasound guidance for vascular access right femoral artery  Placement of a 9 mm proximal 7 mm distal, 4 cm long Exact stent with the use of the NAV-6 embolic protection device in the right carotid artery  PRE-OPERATIVE DIAGNOSIS: 1. High grade right carotid artery stenosis.   POST-OPERATIVE DIAGNOSIS:  Same as above  SURGEON: Selinda Gu, MD  ASSISTANT(S):  none  ANESTHESIA: local/MCS  ESTIMATED BLOOD LOSS:  25 cc  CONTRAST: 70 cc  FLUORO TIME: 5.5 minutes  MODERATE CONSCIOUS SEDATION TIME:  Approximately 28 minutes using 1 mg of Versed and 50 mcg of Fentanyl  FINDING(S): 1.   90% carotid artery stenosis  SPECIMEN(S):   none  INDICATIONS:   Patient is a 89 y.o. female who presents with right grade right carotid artery stenosis.  I have completed Share Decision Making with Aolanis Crispen prior to surgery.  Conversations included: -Discussion of all treatment options including carotid endarterectomy (CEA), CAS (which includes transcarotid artery revascularization (TCAR)), and optimal medical therapy (OMT)). -Explanation of risks and benefits for each option specific to [facility]'s clinical situation. -Integration of clinical guidelines as it relates to the patient's history and co-morbidities -Discussion and incorporation of Shaquilla Kubitz and their personal preferences and priorities in choosing a treatment plan.  If patient was unable to participate in Shared Decision Making this process was done with the patient and her family   Risks and benefits were discussed and informed consent was obtained.   DESCRIPTION: After obtaining full informed written consent, the patient was brought back to the vascular suite and placed supine upon the table.  The patient received IV antibiotics prior to induction. Moderate conscious sedation was administered during a face to face encounter with the patient throughout the procedure  with my supervision of the RN administering medicines and monitoring the patients vital signs and mental status throughout from the start of the procedure until the patient was taken to the recovery room.  After obtaining adequate anesthesia, the patient was prepped and draped in the standard fashion.   The right femoral artery was visualized with ultrasound and found to be widely patent. It was then accessed under direct ultrasound guidance without difficulty with a Seldinger needle. A permanent image was recorded. A J-wire was placed and we then placed a 6 French sheath. The patient was then heparinized and a total of 8000 units of intravenous heparin were given and an ACT was checked to confirm successful anticoagulation. A pigtail catheter was then placed into the ascending aorta. This showed a type 3 aortic arch. I then selectively cannulated the innominate artery without difficulty with a Headhunter catheter and advanced into the mid right common carotid artery.  Cervical and cerebral carotid angiography was then performed. There were no obvious intracranial filling defects with no cross filling. The carotid bifurcation demonstrated a roughly 90% stenosis.  I then advanced into the external carotid artery with a Glidewire and the headhunter catheter and then exchanged for the Amplatz Super Stiff wire. Over the Amplatz Super Stiff wire, a 6 [place] shuttle sheath was placed into the mid common carotid artery. I then used the NAV-6  Embolic protection device and crossed the lesion and parked this in the distal internal carotid artery at the base of the skull.  I then selected a 9 mm proximal, 7 mm distal, 4 cm long Exact stent. This was deployed across the lesion encompassing it in its entirety. A 4.5 mm x 2  cm length balloon was used to post dilate the stent. Only about a 15-20% residual stenosis was present after angioplasty. Completion angiogram showed normal intracranial filling without new defects. At this  point I elected to terminate the procedure. The sheath was removed and Celt closure device was deployed in the right femoral artery with excellent hemostatic result. The patient was taken to the recovery room in stable condition having tolerated the procedure well.  COMPLICATIONS: none  CONDITION: stable  Selinda Gu 04/14/2024 1:05 PM   This note was created with Dragon Medical transcription system. Any errors in dictation are purely unintentional.

## 2024-04-14 NOTE — Interval H&P Note (Signed)
 History and Physical Interval Note:  04/14/2024 9:43 AM  Tanya Larson  has presented today for surgery, with the diagnosis of R Carotid Stent   ABBOTT   Carotid artery stenosis.  The various methods of treatment have been discussed with the patient and family. After consideration of risks, benefits and other options for treatment, the patient has consented to  Procedure(s): CAROTID PTA/STENT INTERVENTION (Right) as a surgical intervention.  The patient's history has been reviewed, patient examined, no change in status, stable for surgery.  I have reviewed the patient's chart and labs.  Questions were answered to the patient's satisfaction.     Eloyce Bultman

## 2024-04-15 ENCOUNTER — Encounter: Payer: Self-pay | Admitting: Vascular Surgery

## 2024-04-15 DIAGNOSIS — I6521 Occlusion and stenosis of right carotid artery: Secondary | ICD-10-CM | POA: Diagnosis not present

## 2024-04-15 LAB — CBC
HCT: 35.4 % — ABNORMAL LOW (ref 36.0–46.0)
Hemoglobin: 11.7 g/dL — ABNORMAL LOW (ref 12.0–15.0)
MCH: 31 pg (ref 26.0–34.0)
MCHC: 33.1 g/dL (ref 30.0–36.0)
MCV: 93.7 fL (ref 80.0–100.0)
Platelets: 152 10*3/uL (ref 150–400)
RBC: 3.78 MIL/uL — ABNORMAL LOW (ref 3.87–5.11)
RDW: 12.1 % (ref 11.5–15.5)
WBC: 6.6 10*3/uL (ref 4.0–10.5)
nRBC: 0 % (ref 0.0–0.2)

## 2024-04-15 LAB — BASIC METABOLIC PANEL WITH GFR
Anion gap: 9 (ref 5–15)
BUN: 19 mg/dL (ref 8–23)
CO2: 22 mmol/L (ref 22–32)
Calcium: 8.7 mg/dL — ABNORMAL LOW (ref 8.9–10.3)
Chloride: 106 mmol/L (ref 98–111)
Creatinine, Ser: 0.81 mg/dL (ref 0.44–1.00)
GFR, Estimated: >60 mL/min (ref >60)
Glucose, Bld: 101 mg/dL — ABNORMAL HIGH (ref 70–99)
Potassium: 3.7 mmol/L (ref 3.5–5.1)
Sodium: 137 mmol/L (ref 135–145)

## 2024-04-15 LAB — POCT ACTIVATED CLOTTING TIME: Activated Clotting Time: 245 s

## 2024-04-15 MED ORDER — CHLORHEXIDINE GLUCONATE CLOTH 2 % EX PADS: 6.0000 | MEDICATED_PAD | Freq: Every day | CUTANEOUS | Status: DC

## 2024-04-15 MED ORDER — PROSIGHT PO TABS
1.0000 | ORAL_TABLET | Freq: Every day | ORAL | Status: DC
  Administered 2024-04-15: 1 via ORAL
  Filled 2024-04-15: qty 1

## 2024-04-15 MED ORDER — CHLORHEXIDINE GLUCONATE CLOTH 2 % EX PADS
6.0000 | MEDICATED_PAD | Freq: Every day | CUTANEOUS | Status: DC
  Administered 2024-04-15: 6 via TOPICAL

## 2024-04-15 MED ORDER — ASPIRIN 81 MG PO TBEC: 81.0000 mg | DELAYED_RELEASE_TABLET | Freq: Every day | ORAL | 12 refills | Status: AC

## 2024-04-15 NOTE — Discharge Instructions (Signed)
 Vascular surgery discharge instructions  Do not lift anything heavy for the next 2 weeks.  Do not lift anything more than a gallon of milk.  Do not drive for the next 2 weeks  Do not exercise or submerse yourself in a pool to exercise for the next week.  You may shower tomorrow on 04/16/2024 after getting home.  Shower with the old dressing in place and remove immediately after showering.  Pat your groin dry and covered with a Band-Aid for the next 3 days.  You are being discharged on aspirin 81 mg daily, Plavix  75 mg daily and pravastatin 40 mg daily.  Do not miss or skip taking any of these medications as it will alter the outcome of your procedure.  Follow-up with vein and vascular surgery as scheduled.

## 2024-04-15 NOTE — Plan of Care (Signed)
  Problem: Health Behavior/Discharge Planning: Goal: Ability to manage health-related needs will improve Outcome: Progressing   Problem: Clinical Measurements: Goal: Ability to maintain clinical measurements within normal limits will improve Outcome: Progressing Goal: Will remain free from infection Outcome: Progressing Goal: Diagnostic test results will improve Outcome: Progressing Goal: Respiratory complications will improve Outcome: Progressing   Problem: Activity: Goal: Risk for activity intolerance will decrease Outcome: Progressing   Problem: Nutrition: Goal: Adequate nutrition will be maintained Outcome: Progressing   Problem: Coping: Goal: Level of anxiety will decrease Outcome: Progressing   Problem: Pain Managment: Goal: General experience of comfort will improve and/or be controlled Outcome: Progressing   Problem: Safety: Goal: Ability to remain free from injury will improve Outcome: Progressing   Problem: Skin Integrity: Goal: Risk for impaired skin integrity will decrease Outcome: Progressing

## 2024-04-15 NOTE — Discharge Summary (Signed)
 [facility]    Discharge Summary    Patient ID:  Tanya Larson MRN: [id number] DOB/AGE: [birth year] 89 y.o.  Admit date: 04/14/2024 Discharge date: 04/15/2024 Date of Surgery: 04/14/2024 Surgeon: Surgeon(s): Marea Selinda RAMAN, MD  Admission Diagnosis: Carotid stenosis, right [I65.21]  Discharge Diagnoses:  Carotid stenosis, right [I65.21]  Secondary Diagnoses: Past Medical History:  Diagnosis Date   Aortic atherosclerosis [facility])    Per New Patient Packet   Aortic stenosis, moderate    Per New Patient Packet   Arthritis    Per New Patient Packet   Bilateral carotid artery stenosis    Per New Patient Packet   Depressive disorder    Per New Patient Packet   GERD (gastroesophageal reflux disease)    Per New Patient Packet   H/O bone density study    Prior to 2019, Per New Patient Packet   History of colonoscopy    Prior to 2019, Per New Patient Packet   History of mammogram    Prior to 2019, Per New Patient Packet   History of Papanicolaou smear of cervix    Prior to 2019, Per New Patient Packet   Hypertension    Hypothyroidism    Per New Patient Packet   Senile osteopenia    Per New Patient Packet   Stage 3 chronic kidney disease [facility])    Per New Patient Packet    Procedure(s): CAROTID PTA/STENT INTERVENTION  Discharged Condition: good  HPI:  Tanya Larson is an 89 year old female now postop day 1 from right carotid endovascular stent placement for right carotid stenosis.  Patient presented to [facility] vascular [facility] as an outpatient for endovascular right carotid stent placement after undergoing CT angiograph of the carotid arteries which confirmed a greater than 70% right ICA stenosis.  Hospital Course:  [name] is a 89 y.o. female is S/P Right carotid endovascular stent placement for right carotid stenosis.  Patient is recovering as expected.  Patient had no complications overnight.  Patient did not require any blood  pressure support or heart rate support.  Patient did experience some dizziness on first ambulation but throughout the day has ambulated the halls without any orthostatic hypotension or dizziness.  Patient is eating well and urinating well.  Vitals are remained stable patient to be discharged later today.  Patient is being discharged on aspirin 81 mg daily, Plavix  75 mg daily and pravastatin 40 mg daily.  Patient was counseled to not miss or skip taking any of these medications as it will affect the outcome of her procedure.  Patient and family members at the bedside verbalized her understanding.  I spent greater than 60 minutes preparing teaching and speaking with the family to do discharge today.   Extubated: POD # 0 Physical Exam:  Alert notes x3, no acute distress Face: Symmetrical.  Tongue is midline. Neck: Trachea is midline.  No swelling or bruising. Cardiovascular: Regular rate and rhythm Pulmonary: Clear to auscultation bilaterally Abdomen: Soft, nontender, nondistended Right groin access: Clean dry and intact.  No swelling or drainage noted Left lower extremity: Thigh soft.  Calf soft.  Extremities warm distally toes.  Hard to palpate pedal pulses however the foot is warm is her good capillary refill. Right lower extremity: Thigh soft.  Calf soft.  Extremities warm distally toes.  Hard to palpate pedal pulses however the foot is warm is her good capillary refill. Neurological: No deficits noted   Post-op wounds:  clean, dry, intact or healing well  Pt. Ambulating, voiding and taking PO diet without difficulty. Pt pain controlled with PO pain meds.  Labs:  As below  Complications: none  Consults:    Significant Diagnostic Studies: CBC Lab Results  Component Value Date   WBC 6.6 04/15/2024   HGB 11.7 (L) 04/15/2024   HCT 35.4 (L) 04/15/2024   MCV 93.7 04/15/2024   PLT 152 04/15/2024    BMET    Component Value Date/Time   NA 137 04/15/2024 0352   K 3.7  04/15/2024 0352   CL 106 04/15/2024 0352   CO2 22 04/15/2024 0352   GLUCOSE 101 (H) 04/15/2024 0352   BUN 19 04/15/2024 0352   CREATININE 0.81 04/15/2024 0352   CALCIUM 8.7 (L) 04/15/2024 0352   GFRNONAA >60 04/15/2024 0352   COAG No results found for: INR, PROTIME   Disposition:  Discharge to :Home  Allergies as of 04/15/2024       Reactions   Erythromycin Hives   Mouth breaks out        Medication List     TAKE these medications    acetaminophen 500 MG tablet Commonly known as: TYLENOL Take 500 mg by mouth every 8 (eight) hours as needed.   amLODipine 2.5 MG tablet Commonly known as: NORVASC Take 2.5 mg by mouth daily.   aspirin EC 81 MG tablet Take 1 tablet (81 mg total) by mouth daily at 6 (six) AM. Swallow whole. Start taking on: April 16, 2024   clopidogrel  75 MG tablet Commonly known as: PLAVIX  Take 75 mg by mouth daily.   escitalopram 10 MG tablet Commonly known as: LEXAPRO Take 1 tablet by mouth daily.   gabapentin 100 MG tablet Commonly known as: NEURONTIN Take 200 mg by mouth at bedtime.   levothyroxine 100 MCG tablet Commonly known as: SYNTHROID Take 100 mcg by mouth daily.   metoprolol tartrate 50 MG tablet Commonly known as: LOPRESSOR Take 50 mg by mouth daily.   pravastatin 40 MG tablet Commonly known as: PRAVACHOL Take 40 mg by mouth daily.   PreserVision AREDS 2 Caps Take 2 capsules by mouth daily at 12 noon.   valsartan 40 MG tablet Commonly known as: DIOVAN Take 80 mg by mouth daily.       Verbal and written Discharge instructions given to the patient. Wound care per Discharge AVS  Follow-up Information     Dew, Selinda RAMAN, MD Follow up in 4 week(s).   Specialties: Vascular Surgery, Radiology, Interventional Cardiology Why: Bialteral Carotid Ultrasounds. Contact information: [address] [place] [address] [phone]                 Signed: Gwendlyn JONELLE Shank, NP  04/15/2024, 4:35  PM

## 2024-04-15 NOTE — Progress Notes (Addendum)
 Tanya Larson  A and O x 4. VSS. Pt tolerating diet well. No complaints of pain or nausea. IV removed intact, prescriptions given. Pt voiced understanding of discharge instructions with no further questions. Pt discharged via wheelchair.     Allergies as of 04/15/2024       Reactions   Erythromycin Hives   Mouth breaks out        Medication List     TAKE these medications    acetaminophen 500 MG tablet Commonly known as: TYLENOL Take 500 mg by mouth every 8 (eight) hours as needed.   amLODipine 2.5 MG tablet Commonly known as: NORVASC Take 2.5 mg by mouth daily.   aspirin EC 81 MG tablet Take 1 tablet (81 mg total) by mouth daily at 6 (six) AM. Swallow whole. Start taking on: April 16, 2024   clopidogrel  75 MG tablet Commonly known as: PLAVIX  Take 75 mg by mouth daily.   escitalopram 10 MG tablet Commonly known as: LEXAPRO Take 1 tablet by mouth daily.   gabapentin 100 MG tablet Commonly known as: NEURONTIN Take 200 mg by mouth at bedtime.   levothyroxine 100 MCG tablet Commonly known as: SYNTHROID Take 100 mcg by mouth daily.   metoprolol tartrate 50 MG tablet Commonly known as: LOPRESSOR Take 50 mg by mouth daily.   pravastatin 40 MG tablet Commonly known as: PRAVACHOL Take 40 mg by mouth daily.   PreserVision AREDS 2 Caps Take 2 capsules by mouth daily at 12 noon.   valsartan 40 MG tablet Commonly known as: DIOVAN Take 80 mg by mouth daily.        Vitals:   04/15/24 1500 04/15/24 1607  BP: 120/61 (!) 140/72  Pulse: 60 (!) 58  Resp: 18 20  Temp:  97.8 F (36.6 C)  SpO2: 99% 100%    Tanya Larson

## 2024-04-17 ENCOUNTER — Ambulatory Visit: Admitting: Nurse Practitioner

## 2024-04-29 ENCOUNTER — Ambulatory Visit
Admission: RE | Admit: 2024-04-29 | Discharge: 2024-04-29 | Disposition: A | Discharge status: Home / Self Care | Attending: Student | Admitting: Student

## 2024-04-29 ENCOUNTER — Ambulatory Visit
Admission: RE | Admit: 2024-04-29 | Discharge: 2024-04-29 | Disposition: A | Discharge status: Home / Self Care | Source: Ambulatory Visit | Attending: Student | Admitting: Student

## 2024-04-29 DIAGNOSIS — M25551 Pain in right hip: Secondary | ICD-10-CM | POA: Insufficient documentation

## 2024-05-07 ENCOUNTER — Other Ambulatory Visit (INDEPENDENT_AMBULATORY_CARE_PROVIDER_SITE_OTHER): Payer: Self-pay | Admitting: Vascular Surgery

## 2024-05-07 DIAGNOSIS — I6523 Occlusion and stenosis of bilateral carotid arteries: Secondary | ICD-10-CM

## 2024-05-12 ENCOUNTER — Other Ambulatory Visit: Payer: Self-pay | Admitting: Student

## 2024-05-12 MED ORDER — ESCITALOPRAM OXALATE 10 MG PO TABS: 10.0000 mg | ORAL_TABLET | Freq: Every day | ORAL | 1 refills | Status: AC

## 2024-05-12 NOTE — Telephone Encounter (Signed)
 Copied from CRM [id number]. Topic: Clinical - Medication Refill >> May 12, 2024  1:10 PM Merlynn A wrote: Medication: escitalopram  (LEXAPRO ) 10 MG tablet  Has the patient contacted their pharmacy? Yes (Agent: If no, request that the patient contact the pharmacy for the refill. If patient does not wish to contact the pharmacy document the reason why and proceed with request.) (Agent: If yes, when and what did the pharmacy advise?)  This is the patient's preferred pharmacy:  [facility] - [place], [place] - [address] [place] [address] Phone: [phone] Fax: [phone]  Is this the correct pharmacy for this prescription? Yes If no, delete pharmacy and type the correct one.   Has the prescription been filled recently? No  Is the patient out of the medication? NO  Has the patient been seen for an appointment in the last year OR does the patient have an upcoming appointment? Yes  Can we respond through MyChart? Yes  Agent: Please be advised that Rx refills may take up to 3 business days. We ask that you follow-up with your pharmacy.

## 2024-05-12 NOTE — Telephone Encounter (Signed)
 Medication has not been refilled by freddrick Abdul Fine, MD Sent rx to PCP for further approval.

## 2024-05-13 ENCOUNTER — Encounter (INDEPENDENT_AMBULATORY_CARE_PROVIDER_SITE_OTHER): Payer: Self-pay | Admitting: Nurse Practitioner

## 2024-05-13 ENCOUNTER — Telehealth: Payer: Self-pay

## 2024-05-13 ENCOUNTER — Ambulatory Visit (INDEPENDENT_AMBULATORY_CARE_PROVIDER_SITE_OTHER): Admitting: Nurse Practitioner

## 2024-05-13 ENCOUNTER — Other Ambulatory Visit (INDEPENDENT_AMBULATORY_CARE_PROVIDER_SITE_OTHER)

## 2024-05-13 VITALS — BP 145/74 | HR 50 | Resp 16 | Ht 66.0 in | Wt 115.6 lb

## 2024-05-13 DIAGNOSIS — I6523 Occlusion and stenosis of bilateral carotid arteries: Secondary | ICD-10-CM | POA: Diagnosis not present

## 2024-05-13 DIAGNOSIS — M545 Low back pain, unspecified: Secondary | ICD-10-CM

## 2024-05-13 NOTE — Progress Notes (Signed)
 Subjective:    Patient ID: Tanya Larson, female    DOB: June 04, 1933, 89 y.o.   MRN: [id number] Chief Complaint  Patient presents with   Follow-up    4 week Follow up carotid     The patient is seen for follow up evaluation of carotid stenosis status post right carotid stent on 04/14/2024.  There were no post operative problems or complications related to the surgery.  The patient denies neck or incisional pain.  The patient denies interval amaurosis fugax. There is no recent history of TIA symptoms or focal motor deficits. There is no prior documented CVA.  The patient denies headache.  The patient is taking enteric-coated aspirin  81 mg daily.  No recent shortening of the patient's walking distance or new symptoms consistent with claudication.  No history of rest pain symptoms. No new ulcers or wounds of the lower extremities have occurred.  There is no history of DVT, PE or superficial thrombophlebitis. No recent episodes of angina or shortness of breath documented.   Today noninvasive study showed patent stent with 1 to 39% left ICA stenosis     Review of Systems  Eyes:  Positive for redness.  All other systems reviewed and are negative.      Objective:   Physical Exam Vitals reviewed.  HENT:     Head: Normocephalic.  Cardiovascular:     Rate and Rhythm: Normal rate.     Pulses: Normal pulses.  Pulmonary:     Effort: Pulmonary effort is normal.  Skin:    General: Skin is warm and dry.  Neurological:     General: No focal deficit present.     Mental Status: She is alert and oriented to person, place, and time.  Psychiatric:        Mood and Affect: Mood normal.        Behavior: Behavior normal.        Thought Content: Thought content normal.        Judgment: Judgment normal.     BP (!) 145/74 (BP Location: Right Arm, Patient Position: Sitting, Cuff Size: Small)   Pulse (!) 50   Resp 16   Ht 5' 6 (1.676 m)   Wt 115 lb 9.6 oz (52.4 kg)   BMI 18.66 kg/m    Past Medical History:  Diagnosis Date   Aortic atherosclerosis (HCC)    Per New Patient Packet   Aortic stenosis, moderate    Per New Patient Packet   Arthritis    Per New Patient Packet   Bilateral carotid artery stenosis    Per New Patient Packet   Depressive disorder    Per New Patient Packet   GERD (gastroesophageal reflux disease)    Per New Patient Packet   H/O bone density study    Prior to 2019, Per New Patient Packet   History of colonoscopy    Prior to 2019, Per New Patient Packet   History of mammogram    Prior to 2019, Per New Patient Packet   History of Papanicolaou smear of cervix    Prior to 2019, Per New Patient Packet   Hypertension    Hypothyroidism    Per New Patient Packet   Senile osteopenia    Per New Patient Packet   Stage 3 chronic kidney disease [facility])    Per New Patient Packet    Social History   Socioeconomic History   Marital status: Widowed    Spouse name: Not on file   Number  of children: Not on file   Years of education: Not on file   Highest education level: Associate degree: occupational, Scientist, product/process development, or vocational program  Occupational History   Not on file  Tobacco Use   Smoking status: Former    Current packs/day: 0.00    Types: Cigarettes    Quit date: 35    Years since quitting: 42.5   Smokeless tobacco: Never  Vaping Use   Vaping status: Never Used  Substance and Sexual Activity   Alcohol use: Yes    Comment: social drinker   Drug use: Never   Sexual activity: Not on file  Other Topics Concern   Not on file  Social History Narrative   Not on file   Social Drivers of Health   Financial Resource Strain: Low Risk  (12/25/2023)   Received from [facility]   Overall Financial Resource Strain (CARDIA)    Difficulty of Paying Living Expenses: Not hard at all  Food Insecurity: No Food Insecurity (04/14/2024)   Hunger Vital Sign    Worried About Running Out of Food in the Last Year: Never true    Ran  Out of Food in the Last Year: Never true  Transportation Needs: No Transportation Needs (04/14/2024)   PRAPARE - Administrator, Civil Service (Medical): No    Lack of Transportation (Non-Medical): No  Physical Activity: Sufficiently Active (05/25/2023)   Exercise Vital Sign    Days of Exercise per Week: 7 days    Minutes of Exercise per Session: 30 min  Stress: No Stress Concern Present (05/25/2023)   [employer] of Occupational Health - Occupational Stress Questionnaire    Feeling of Stress : Only a little  Social Connections: Moderately Integrated (04/14/2024)   Social Connection and Isolation Panel    Frequency of Communication with Friends and Family: More than three times a week    Frequency of Social Gatherings with Friends and Family: Twice a week    Attends Religious Services: More than 4 times per year    Active Member of [employer] or Organizations: No    Attends Banker Meetings: 1 to 4 times per year    Marital Status: Widowed  Intimate Partner Violence: Not At Risk (04/14/2024)   Humiliation, Afraid, Rape, and Kick questionnaire    Fear of Current or Ex-Partner: No    Emotionally Abused: No    Physically Abused: No    Sexually Abused: No    Past Surgical History:  Procedure Laterality Date   ANKLE SURGERY Right 2019   APPENDECTOMY     arm surgery Right    CAROTID PTA/STENT INTERVENTION Right 04/14/2024   Procedure: CAROTID PTA/STENT INTERVENTION;  Surgeon: Marea Selinda RAMAN, MD;  Location: [facility] INVASIVE CV LAB;  Service: Cardiovascular;  Laterality: Right;   EYE SURGERY      Family History  Problem Relation Age of Onset   Aortic stenosis Mother    Dementia Father    Congestive Heart Failure Brother    Kidney disease Brother    Suicidality Brother     Allergies  Allergen Reactions   Erythromycin Hives    Mouth breaks out       Latest Ref Rng & Units 04/15/2024    3:52 AM 04/14/2024    3:27 PM 05/04/2022   12:02 PM  CBC  WBC 4.0 - 10.5  K/uL 6.6  5.4  10.1   Hemoglobin 12.0 - 15.0 g/dL 88.2  86.9  13.9  Hematocrit 36.0 - 46.0 % 35.4  41.4  43.0   Platelets 150 - 400 K/uL 152  176  211       CMP     Component Value Date/Time   NA 137 04/15/2024 0352   K 3.7 04/15/2024 0352   CL 106 04/15/2024 0352   CO2 22 04/15/2024 0352   GLUCOSE 101 (H) 04/15/2024 0352   BUN 19 04/15/2024 0352   CREATININE 0.81 04/15/2024 0352   CALCIUM 8.7 (L) 04/15/2024 0352   GFRNONAA >60 04/15/2024 0352     No results found.     Assessment & Plan:   1. Bilateral carotid artery stenosis (Primary) Recommend:  The patient is s/p successful right carotid stent  Duplex ultrasound  shows 1 to 39% left ICA stenosis.  Widely patent right carotid stent  Continue dual antiplatelet therapy as prescribed for a minimum of 6 months Continue management of CAD, HTN and Hyperlipidemia Healthy heart diet,  encouraged exercise at least 4 times per week  The patient's NIHSS score is as follows: 0 Mild: 1 - 5 Mild to Moderately Severe: 5 - 14 Severe: 15 - 24 Very Severe: >25  Follow up in 3 months with duplex ultrasound and physical exam based on the patient's carotid stent   Current Outpatient Medications on File Prior to Visit  Medication Sig Dispense Refill   acetaminophen  (TYLENOL ) 500 MG tablet Take 500 mg by mouth every 8 (eight) hours as needed.     amLODipine  (NORVASC ) 2.5 MG tablet Take 2.5 mg by mouth daily.     aspirin  EC 81 MG tablet Take 1 tablet (81 mg total) by mouth daily at 6 (six) AM. Swallow whole. 30 tablet 12   clopidogrel  (PLAVIX ) 75 MG tablet Take 75 mg by mouth daily.     escitalopram  (LEXAPRO ) 10 MG tablet Take 1 tablet (10 mg total) by mouth daily. 90 tablet 1   gabapentin  (NEURONTIN ) 100 MG tablet Take 200 mg by mouth at bedtime.     levothyroxine  (SYNTHROID ) 100 MCG tablet Take 100 mcg by mouth daily.     metoprolol  tartrate (LOPRESSOR ) 50 MG tablet Take 50 mg by mouth daily.     Multiple Vitamins-Minerals  (PRESERVISION AREDS 2) CAPS Take 2 capsules by mouth daily at 12 noon.     pravastatin  (PRAVACHOL ) 40 MG tablet Take 40 mg by mouth daily.     valsartan (DIOVAN) 40 MG tablet Take 80 mg by mouth daily.     No current facility-administered medications on file prior to visit.    There are no Patient Instructions on file for this visit. No follow-ups on file.   Kiron Osmun E Daphanie Oquendo, NP

## 2024-05-13 NOTE — Telephone Encounter (Signed)
 LVM  for patient to go to [facility] and have her x rays done before  she comes to her appointment  the address was provided and the orders are in

## 2024-05-14 ENCOUNTER — Ambulatory Visit: Admitting: Orthopedic Surgery

## 2024-05-14 ENCOUNTER — Ambulatory Visit: Payer: Self-pay | Admitting: Student

## 2024-05-15 ENCOUNTER — Ambulatory Visit (INDEPENDENT_AMBULATORY_CARE_PROVIDER_SITE_OTHER): Admitting: Orthopedic Surgery

## 2024-05-15 VITALS — BP 137/73 | Ht 66.0 in | Wt 116.0 lb

## 2024-05-15 DIAGNOSIS — G8929 Other chronic pain: Secondary | ICD-10-CM | POA: Diagnosis not present

## 2024-05-15 DIAGNOSIS — M545 Low back pain, unspecified: Secondary | ICD-10-CM

## 2024-05-15 DIAGNOSIS — M1611 Unilateral primary osteoarthritis, right hip: Secondary | ICD-10-CM

## 2024-05-15 NOTE — Patient Instructions (Signed)

## 2024-05-16 ENCOUNTER — Encounter: Payer: Self-pay | Admitting: Orthopedic Surgery

## 2024-05-16 NOTE — Telephone Encounter (Signed)
 Tried contacting patient regarding message. After reviewing pt chart patient has an appointment with ortho yesterday which was referred by Dr. Abdul.

## 2024-05-16 NOTE — Progress Notes (Signed)
 New Patient Visit  Assessment: Tanya Larson is a 89 y.o. female with the following: 1. Chronic bilateral low back pain without sciatica 2.  Right hip arthritis   Plan: Tanya Larson has pain primarily in the right anterior hip and groin area.  She also has some pain in the right buttock.  Reviewed radiographs of the right hip with the patient in clinic today, which demonstrates significant osteophytes, with loss of joint space.  Findings consistent with arthritis.  She states that it affects her regular activities.  We discussed multiple treatment options.  She elected proceed with an ultrasound-guided hip injection.  Continue with medicine such as Tylenol  as needed.  She may ultimately benefit from a hip replacement.  A component of her pain may also be her low back.  She has been referred to physical therapy for both her hip and her back.  All questions have been answered.  She will return to clinic as needed.  Procedure note injection - Right hip, ultrasound guidance   Verbal consent was obtained to inject the Right hip joint  Timeout was completed to confirm the site of injection.   Using the ultrasound, the femoral neck was identified.  The joint space was also identified. The skin was prepped with alcohol and ethyl chloride was sprayed at the injection site.  A 21-gauge needle was used to inject 40 mg of Depo-Medrol  and 1% lidocaine  (4 cc) into the hip joint of the Right hip using a direct anterior approach.  The needle was visualized entering the hip joint, and the medication was also visualized. There were no complications.  A sterile bandage was applied.   Note: In order to accurately identify the placement of the needle, ultrasound was required, to increase the accuracy, and specificity of the injection.   Follow-up: Return if symptoms worsen or fail to improve.  Subjective:  Chief Complaint  Patient presents with   Hip Pain    Pain in the right hip since August  has  become worse since the spring     History of Present Illness: Tanya Larson is a 89 y.o. female who has been referred by Richerd Brigham, MD for evaluation of right hip pain.  She states that she has had pain in the anterior hip and groin area for almost a year.  Is gotten worse in the past couple of months.  It is affecting her ability to walk on a daily basis.  She remains active.  She walks her dog regularly.  She is also complaining of pain in the buttock.  No specific injury.  She occasionally takes some Tylenol  for pain.  She has completed some physical therapy recently, which was helpful.  No prior injections.   Review of Systems: No fevers or chills No numbness or tingling No chest pain No shortness of breath No bowel or bladder dysfunction No GI distress No headaches   Medical History:  Past Medical History:  Diagnosis Date   Aortic atherosclerosis (HCC)    Per New Patient Packet   Aortic stenosis, moderate    Per New Patient Packet   Arthritis    Per New Patient Packet   Bilateral carotid artery stenosis    Per New Patient Packet   Depressive disorder    Per New Patient Packet   GERD (gastroesophageal reflux disease)    Per New Patient Packet   H/O bone density study    Prior to 2019, Per New Patient Packet   History of colonoscopy  Prior to 2019, Per New Patient Packet   History of mammogram    Prior to 2019, Per New Patient Packet   History of Papanicolaou smear of cervix    Prior to 2019, Per New Patient Packet   Hypertension    Hypothyroidism    Per New Patient Packet   Senile osteopenia    Per New Patient Packet   Stage 3 chronic kidney disease [facility])    Per New Patient Packet    Past Surgical History:  Procedure Laterality Date   ANKLE SURGERY Right 2019   APPENDECTOMY     arm surgery Right    CAROTID PTA/STENT INTERVENTION Right 04/14/2024   Procedure: CAROTID PTA/STENT INTERVENTION;  Surgeon: Marea Selinda RAMAN, MD;  Location: [facility] INVASIVE CV  LAB;  Service: Cardiovascular;  Laterality: Right;   EYE SURGERY      Family History  Problem Relation Age of Onset   Aortic stenosis Mother    Dementia Father    Congestive Heart Failure Brother    Kidney disease Brother    Suicidality Brother    Social History   Tobacco Use   Smoking status: Former    Current packs/day: 0.00    Types: Cigarettes    Quit date: 1983    Years since quitting: 42.5   Smokeless tobacco: Never  Vaping Use   Vaping status: Never Used  Substance Use Topics   Alcohol use: Yes    Comment: social drinker   Drug use: Never    Allergies  Allergen Reactions   Erythromycin Hives    Mouth breaks out    Current Meds  Medication Sig   acetaminophen  (TYLENOL ) 500 MG tablet Take 500 mg by mouth every 8 (eight) hours as needed.   amLODipine  (NORVASC ) 2.5 MG tablet Take 2.5 mg by mouth daily.   aspirin  EC 81 MG tablet Take 1 tablet (81 mg total) by mouth daily at 6 (six) AM. Swallow whole.   clopidogrel  (PLAVIX ) 75 MG tablet Take 75 mg by mouth daily.   escitalopram  (LEXAPRO ) 10 MG tablet Take 1 tablet (10 mg total) by mouth daily.   gabapentin  (NEURONTIN ) 100 MG tablet Take 200 mg by mouth at bedtime.   levothyroxine  (SYNTHROID ) 100 MCG tablet Take 100 mcg by mouth daily.   metoprolol  tartrate (LOPRESSOR ) 50 MG tablet Take 50 mg by mouth daily.   Multiple Vitamins-Minerals (PRESERVISION AREDS 2) CAPS Take 2 capsules by mouth daily at 12 noon.   pravastatin  (PRAVACHOL ) 40 MG tablet Take 40 mg by mouth daily.   valsartan (DIOVAN) 40 MG tablet Take 80 mg by mouth daily.    Objective: BP 137/73   Ht 5' 6 (1.676 m)   Wt 116 lb (52.6 kg)   BMI 18.72 kg/m   Physical Exam:  General: Alert and oriented. and No acute distress. Gait: Right sided antalgic gait.  Mild tenderness in the right buttock.  No tenderness in the groin.  Slightly restricted internal and external rotation of the right hip, with minimal discomfort.  She is able to maintain a  straight leg raise.  She has good strength distally.  Sensation is intact to the dorsum of the foot.  2+ DP pulse.  IMAGING: I personally reviewed images previously obtained in clinic   X-rays of the right hip were available in clinic today.  She has large osteophytes projecting off the femoral head.  Minimal joint space remaining.  No obvious subchondral cysts.   New Medications:  No orders of the defined  types were placed in this encounter.     Oneil DELENA Horde, MD  05/16/2024 7:31 AM

## 2024-06-06 ENCOUNTER — Ambulatory Visit: Admitting: Student

## 2024-06-06 ENCOUNTER — Encounter: Payer: Self-pay | Admitting: Student

## 2024-06-06 VITALS — BP 116/64 | HR 59 | Temp 96.4°F | Ht 66.0 in | Wt 115.0 lb

## 2024-06-06 DIAGNOSIS — G3184 Mild cognitive impairment, so stated: Secondary | ICD-10-CM

## 2024-06-06 DIAGNOSIS — M160 Bilateral primary osteoarthritis of hip: Secondary | ICD-10-CM | POA: Diagnosis not present

## 2024-06-06 NOTE — Patient Instructions (Signed)
 VISIT SUMMARY:  Today, we discussed your persistent right hip pain, memory concerns, and a chronic ear wound. We also reviewed your current medications and their potential side effects.  YOUR PLAN:  -RIGHT HIP OSTEOARTHRITIS: Osteoarthritis is a condition where the cartilage in your joints wears down over time, causing pain and stiffness. We will refer you to physical therapy to help manage the pain and improve your mobility. We will follow up in two months to see if there is any improvement and discuss the possibility of surgery if necessary.  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 We will contact your daughter to discuss your memory changes and gather more information. We will also follow up with another memory evaluation in a few months to see if there are any changes and consider adjusting your gabapentin  if it is affecting your cognitive function.  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 You have an appointment with an ear specialist next week, and a skin graft might be an option for treatment after we address your hip issues.  -DEPRESSION: Your mood is currently well-managed with your Lexapro  medication, so no changes are needed at this time.  INSTRUCTIONS:  Please schedule and attend your physical therapy sessions for your right hip. Follow up with us  in two months to assess your hip pain and discuss potential surgical options. We will also contact your daughter to discuss your memory changes and will follow up with another memory evaluation in a few months. Attend your appointment with the ear specialist next week to discuss treatment options for your ear wound.

## 2024-06-08 ENCOUNTER — Encounter: Payer: Self-pay | Admitting: Student

## 2024-06-08 NOTE — Progress Notes (Signed)
 [facility]: [facility] Provider: ABDUL  Code Status: Full code  Goals of Care:     06/06/2024   10:45 AM  Advanced Directives  Does Patient Have a Medical Advance Directive? Yes  Type of Estate agent of [place];Out of facility DNR (pink MOST or yellow form)  Does patient want to make changes to medical advance directive? No - Patient declined  Copy of Healthcare Power of Attorney in Chart? No - copy requested     Chief Complaint  Patient presents with   Medical Management of Chronic Issues    Medical Management of Chronic Issues. 2 Month follow up    HPI: Patient is a 89 y.o. female seen today for medical management of chronic diseases.   Discussed the use of AI scribe software for clinical note transcription with the patient, who gave verbal consent to proceed.  History of Present Illness   Tanya Larson is an 89 year old female who presents with persistent right hip pain.  She experiences persistent pain in her right hip, which is more severe than in the left. Imaging from July revealed moderate degenerative changes in the right hip and mild changes in the left. An injection in the right hip at the end of July did not alleviate her symptoms. She wants to pursue physical therapy but has not yet been scheduled.  The hip pain has significantly impacted her ability to maintain her previous level of activity, which she finds distressing. She has a history of being very active, having grown up [place] a farm, walked the [employer], and played tennis.  She is currently taking gabapentin , which she believes affects her arm and sleep. Additionally, her back has started to hurt, possibly in association with her hip pain. No headache today. She reports issues with sleep, possibly related to gabapentin  use. She does not report any issues with driving or getting lost.  She mentions a problem with her ear, which has exposed bone and  bled profusely in the past week. She has an upcoming appointment with an ear specialist.  She has coping mechanisms for daily tasks, such as using a light to remind her to check the stove, due to a family history of house fires. Her father lived to 25 years old and had memory changes. He lived independently on a farm until moving to live with her and then to independent living.         Past Medical History:  Diagnosis Date   Aortic atherosclerosis [facility])    Per New Patient Packet   Aortic stenosis, moderate    Per New Patient Packet   Arthritis    Per New Patient Packet   Bilateral carotid artery stenosis    Per New Patient Packet   Depressive disorder    Per New Patient Packet   GERD (gastroesophageal reflux disease)    Per New Patient Packet   H/O bone density study    Prior to 2019, Per New Patient Packet   History of colonoscopy    Prior to 2019, Per New Patient Packet   History of mammogram    Prior to 2019, Per New Patient Packet   History of Papanicolaou smear of cervix    Prior to 2019, Per New Patient Packet   Hypertension    Hypothyroidism    Per New Patient Packet   Senile osteopenia    Per New Patient Packet   Stage 3 chronic kidney disease (  [facility])    Per New Patient Packet    Past Surgical History:  Procedure Laterality Date   ANKLE SURGERY Right 2019   APPENDECTOMY     arm surgery Right    CAROTID PTA/STENT INTERVENTION Right 04/14/2024   Procedure: CAROTID PTA/STENT INTERVENTION;  Surgeon: Marea Selinda RAMAN, MD;  Location: [facility] INVASIVE CV LAB;  Service: Cardiovascular;  Laterality: Right;   EYE SURGERY      Allergies  Allergen Reactions   Erythromycin Hives    Mouth breaks out    Outpatient Encounter Medications as of 06/06/2024  Medication Sig   acetaminophen  (TYLENOL ) 500 MG tablet Take 500 mg by mouth every 8 (eight) hours as needed.   amLODipine  (NORVASC ) 2.5 MG tablet Take 2.5 mg by mouth daily.   aspirin  EC 81 MG tablet Take 1 tablet (81 mg  total) by mouth daily at 6 (six) AM. Swallow whole.   clopidogrel  (PLAVIX ) 75 MG tablet Take 75 mg by mouth daily.   escitalopram  (LEXAPRO ) 10 MG tablet Take 1 tablet (10 mg total) by mouth daily.   gabapentin  (NEURONTIN ) 100 MG tablet Take 200 mg by mouth at bedtime.   levothyroxine  (SYNTHROID ) 100 MCG tablet Take 100 mcg by mouth daily.   metoprolol  tartrate (LOPRESSOR ) 50 MG tablet Take 50 mg by mouth daily.   Multiple Vitamins-Minerals (PRESERVISION AREDS 2) CAPS Take 2 capsules by mouth daily at 12 noon.   pravastatin  (PRAVACHOL ) 40 MG tablet Take 40 mg by mouth daily.   valsartan (DIOVAN) 40 MG tablet Take 80 mg by mouth daily.   No facility-administered encounter medications on file as of 06/06/2024.    Review of Systems:  Review of Systems  Health Maintenance  Topic Date Due   Medicare Annual Wellness (AWV)  Never done   Zoster Vaccines- Shingrix (1 of 2) 10/16/1985   DEXA SCAN  Never done   DTaP/Tdap/Td (3 - Td or Tdap) 12/31/2022   INFLUENZA VACCINE  05/23/2024   COVID-19 Vaccine (8 - 2024-25 season) 08/02/2024   Pneumococcal Vaccine: 50+ Years  Completed   HPV VACCINES  Aged Out   Meningococcal B Vaccine  Aged Out    Physical Exam: Vitals:   06/06/24 1042  BP: 116/64  Pulse: (!) 59  Temp: (!) 96.4 F (35.8 C)  SpO2: 98%  Weight: 115 lb (52.2 kg)  Height: 5' 6 (1.676 m)   Body mass index is 18.56 kg/m. Physical Exam Constitutional:      Appearance: Normal appearance.  Cardiovascular:     Rate and Rhythm: Normal rate and regular rhythm.     Pulses: Normal pulses.     Heart sounds: Normal heart sounds.  Pulmonary:     Effort: Pulmonary effort is normal.  Abdominal:     General: Abdomen is flat. Bowel sounds are normal.     Palpations: Abdomen is soft.  Musculoskeletal:        General: No swelling or tenderness.  Skin:    General: Skin is warm and dry.  Neurological:     Mental Status: She is alert and oriented to person, place, and time.     Gait:  Gait normal.     Comments: SLUMS 19/30  Psychiatric:        Mood and Affect: Mood normal.    Physical Exam   HEENT: No active bleeding in the ears.      Labs reviewed: Basic Metabolic Panel: Recent Labs    03/18/24 1507 04/14/24 1020 04/15/24 0352  NA  --   --  137  K  --   --  3.7  CL  --   --  106  CO2  --   --  22  GLUCOSE  --   --  101*  BUN  --  23 19  CREATININE 1.10* 1.06* 0.81  CALCIUM  --   --  8.7*   Liver Function Tests: No results for input(s): AST, ALT, ALKPHOS, BILITOT, PROT, ALBUMIN in the last 8760 hours. No results for input(s): LIPASE, AMYLASE in the last 8760 hours. No results for input(s): AMMONIA in the last 8760 hours. CBC: Recent Labs    04/14/24 1527 04/15/24 0352  WBC 5.4 6.6  HGB 13.0 11.7*  HCT 41.4 35.4*  MCV 97.9 93.7  PLT 176 152   Lipid Panel: No results for input(s): CHOL, HDL, LDLCALC, TRIG, CHOLHDL, LDLDIRECT in the last 8760 hours. No results found for: HGBA1C  Procedures since last visit: VAS US  CAROTID Result Date: 05/19/2024 Carotid Arterial Duplex Study Patient Name:  Tanya Larson  Date of Exam:   05/13/2024 Medical Rec #: [id number]        Accession #:    [id number] Date of Birth: 04-Aug-1933       Patient Gender: F Patient Age:   84 years Exam Location:  [facility] Procedure:      VAS US  CAROTID Referring Phys: SELINDA GU --------------------------------------------------------------------------------  Indications:   Carotid artery disease. Other Factors: 04/14/2024 Rt ICA stent placed. Performing Technologist: Jerel Croak RVT  Examination Guidelines: A complete evaluation includes B-mode imaging, spectral Doppler, color Doppler, and power Doppler as needed of all accessible portions of each vessel. Bilateral testing is considered an integral part of a complete examination. Limited examinations for reoccurring indications may be performed as noted.  Right Carotid Findings:  +----------+--------+--------+--------+------------------+--------+           PSV cm/sEDV cm/sStenosisPlaque DescriptionComments +----------+--------+--------+--------+------------------+--------+ CCA Prox  66      12                                         +----------+--------+--------+--------+------------------+--------+ CCA Mid   76      13                                         +----------+--------+--------+--------+------------------+--------+ CCA Distal62      11                                stent    +----------+--------+--------+--------+------------------+--------+ ICA Prox  64      19                                stent    +----------+--------+--------+--------+------------------+--------+ ICA Mid   89      23                                         +----------+--------+--------+--------+------------------+--------+ ICA Distal72      13                                         +----------+--------+--------+--------+------------------+--------+  ECA       51      1                                          +----------+--------+--------+--------+------------------+--------+ +----------+--------+-------+----------------+-------------------+           PSV cm/sEDV cmsDescribe        Arm Pressure (mmHG) +----------+--------+-------+----------------+-------------------+ [id number]             Multiphasic, WNL                    +----------+--------+-------+----------------+-------------------+ +---------+--------+--+--------+-+---------+ VertebralPSV cm/s44EDV cm/s9Antegrade +---------+--------+--+--------+-+---------+  Left Carotid Findings: +----------+--------+--------+--------+-----------------------+--------+           PSV cm/sEDV cm/sStenosisPlaque Description     Comments +----------+--------+--------+--------+-----------------------+--------+ CCA Prox  58      13                                               +----------+--------+--------+--------+-----------------------+--------+ CCA Mid   64      13                                              +----------+--------+--------+--------+-----------------------+--------+ CCA Distal57      12                                              +----------+--------+--------+--------+-----------------------+--------+ ICA Prox  51      14      1-39%   heterogenous and smooth         +----------+--------+--------+--------+-----------------------+--------+ ICA Mid   42      12                                              +----------+--------+--------+--------+-----------------------+--------+ ICA Distal52      17                                              +----------+--------+--------+--------+-----------------------+--------+ ECA       66      2                                               +----------+--------+--------+--------+-----------------------+--------+ +----------+--------+--------+----------------+-------------------+           PSV cm/sEDV cm/sDescribe        Arm Pressure (mmHG) +----------+--------+--------+----------------+-------------------+ [id number]              Multiphasic, WNL                    +----------+--------+--------+----------------+-------------------+ +---------+--------+--+--------+-+---------+ VertebralPSV cm/s31EDV cm/s5Antegrade +---------+--------+--+--------+-+---------+   Summary: Right Carotid: The ECA appears <50% stenosed. Widely patent ICA s/p ICA stent. Left Carotid:  Velocities in the left ICA are consistent with a 1-39% stenosis.               Non-hemodynamically significant plaque <50% noted in the CCA. The               ECA appears <50% stenosed.  *See table(s) above for measurements and observations.  Electronically signed by Selinda Gu MD on 05/19/2024 at 8:37:18 AM.    Final    Results   RADIOLOGY Hip X-ray: Moderate right hip degenerative changes, mild left hip  degenerative changes (04/2022)      Assessment/Plan    Right hip osteoarthritis Moderate degenerative changes in the right hip confirmed by imaging in July. Previous injection treatment was ineffective. Significant impact on activity level and quality of life due to hip pain. - Write referral for physical therapy to address right hip degenerative changes - Follow up in two months to assess improvement and consider surgical intervention if necessary  Age-related cognitive impairment Memory evaluation indicates deficits in short-term memory recall and potential frontal cortex deterioration. Scored borderline for dementia. Gabapentin  may be affecting cognitive function. 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 Upcoming appointment with an ear specialist and skin graft offered as a treatment option. - Attend scheduled appointment with ear specialist next week - Consider skin graft as a treatment option after addressing hip issues  Depression Mood is well-managed on current Lexapro  regimen.          Labs/tests ordered:  * No order type specified * Next appt:  06/26/2024

## 2024-06-11 ENCOUNTER — Telehealth: Payer: Self-pay | Admitting: Student

## 2024-06-11 NOTE — Telephone Encounter (Unsigned)
 Her mother has not had cognitive changes from her perspective. She was very stressed about taking it .  She drives around [place] and does go to [place] at times. Her daughter hasn't had any concerns about her driving practices.   She hasn't had trouble with finding a new place.   She did have the carotid procedure and was under anesthesia, it can be difficult to remember.   Will focus on hip and decreasing pain medications. Will f/u with repeat memory assessment in 3-6 months once medication is discontinued.

## 2024-06-26 ENCOUNTER — Ambulatory Visit: Admitting: Nurse Practitioner

## 2024-06-27 NOTE — Patient Instructions (Signed)
 1.) Visit your local pharmacy to receive your shingles vaccine and tetanus booster.   2.) You can expect a call to schedule your Bone Density Scan

## 2024-06-30 ENCOUNTER — Encounter: Admitting: Nurse Practitioner

## 2024-06-30 DIAGNOSIS — Z23 Encounter for immunization: Secondary | ICD-10-CM

## 2024-06-30 NOTE — Progress Notes (Signed)
 This encounter was created in error - please disregard.

## 2024-07-03 NOTE — Addendum Note (Signed)
 Addended by: Cherry Wittwer on: 07/03/2024 07:49 PM   Modules accepted: Orders

## 2024-08-06 ENCOUNTER — Encounter: Admitting: Student

## 2024-08-11 ENCOUNTER — Other Ambulatory Visit (INDEPENDENT_AMBULATORY_CARE_PROVIDER_SITE_OTHER): Payer: Self-pay | Admitting: Vascular Surgery

## 2024-08-11 DIAGNOSIS — I6523 Occlusion and stenosis of bilateral carotid arteries: Secondary | ICD-10-CM

## 2024-08-12 ENCOUNTER — Ambulatory Visit (INDEPENDENT_AMBULATORY_CARE_PROVIDER_SITE_OTHER): Admitting: Vascular Surgery

## 2024-08-12 ENCOUNTER — Encounter (INDEPENDENT_AMBULATORY_CARE_PROVIDER_SITE_OTHER): Payer: Self-pay | Admitting: Vascular Surgery

## 2024-08-12 ENCOUNTER — Ambulatory Visit (INDEPENDENT_AMBULATORY_CARE_PROVIDER_SITE_OTHER)

## 2024-08-12 VITALS — BP 92/58 | HR 70 | Resp 18 | Ht 66.0 in | Wt 115.4 lb

## 2024-08-12 DIAGNOSIS — E7800 Pure hypercholesterolemia, unspecified: Secondary | ICD-10-CM | POA: Diagnosis not present

## 2024-08-12 DIAGNOSIS — I6523 Occlusion and stenosis of bilateral carotid arteries: Secondary | ICD-10-CM | POA: Diagnosis not present

## 2024-08-12 DIAGNOSIS — I10 Essential (primary) hypertension: Secondary | ICD-10-CM

## 2024-08-12 NOTE — Assessment & Plan Note (Signed)
 Carotid duplex today shows a widely patent right carotid stent and 1 to 39% left ICA stenosis.  She will continue her aspirin , Plavix , and Pravachol .  If she needs to stop her Plavix  for a hip replacement, she is beyond 90 days from the procedure so that would be acceptable.  I would hold it 5 days preoperatively and resume following the procedure whenever felt safe by her orthopedic surgeon.  We will plan a follow-up visit in 6 months.

## 2024-08-12 NOTE — Progress Notes (Signed)
 MRN : [id number]  Tanya Larson is a 89 y.o. (04-22-1933) female who presents with chief complaint of  Chief Complaint  Patient presents with   Foot Ulcer    3 month  and carotid   .  History of Present Illness: Patient returns today in follow up of her carotid disease.  She is now about 4 months status post right carotid stent placement for high-grade stenosis.  She is doing great.  She has had no periprocedural or postoperative complications.  Her biggest issue currently is her right hip and it sounds like she may need a right hip replacement going forward.  She follows with Dr. Liana for this.  Carotid duplex today shows a widely patent right carotid stent and 1 to 39% left ICA stenosis.  Current Outpatient Medications  Medication Sig Dispense Refill   acetaminophen  (TYLENOL ) 500 MG tablet Take 500 mg by mouth every 8 (eight) hours as needed.     amLODipine  (NORVASC ) 2.5 MG tablet Take 2.5 mg by mouth daily.     aspirin  EC 81 MG tablet Take 1 tablet (81 mg total) by mouth daily at 6 (six) AM. Swallow whole. 30 tablet 12   clopidogrel  (PLAVIX ) 75 MG tablet Take 75 mg by mouth daily.     escitalopram  (LEXAPRO ) 10 MG tablet Take 1 tablet (10 mg total) by mouth daily. 90 tablet 1   ferrous sulfate (FER-IN-SOL) 75 (15 Fe) MG/ML SOLN Take by mouth.     gabapentin  (NEURONTIN ) 100 MG tablet Take 200 mg by mouth at bedtime.     levothyroxine  (SYNTHROID ) 100 MCG tablet Take 100 mcg by mouth daily.     metoprolol  tartrate (LOPRESSOR ) 50 MG tablet Take 50 mg by mouth daily.     Multiple Vitamins-Minerals (PRESERVISION AREDS 2) CAPS Take 2 capsules by mouth daily at 12 noon.     pravastatin  (PRAVACHOL ) 40 MG tablet Take 40 mg by mouth daily.     valsartan (DIOVAN) 40 MG tablet Take 80 mg by mouth daily.     No current facility-administered medications for this visit.    Past Medical History:  Diagnosis Date   Aortic atherosclerosis    Per New Patient Packet   Aortic stenosis,  moderate    Per New Patient Packet   Arthritis    Per New Patient Packet   Bilateral carotid artery stenosis    Per New Patient Packet   Depressive disorder    Per New Patient Packet   GERD (gastroesophageal reflux disease)    Per New Patient Packet   H/O bone density study    Prior to 2019, Per New Patient Packet   History of colonoscopy    Prior to 2019, Per New Patient Packet   History of mammogram    Prior to 2019, Per New Patient Packet   History of Papanicolaou smear of cervix    Prior to 2019, Per New Patient Packet   Hypertension    Hypothyroidism    Per New Patient Packet   Senile osteopenia    Per New Patient Packet   Stage 3 chronic kidney disease [facility])    Per New Patient Packet    Past Surgical History:  Procedure Laterality Date   ANKLE SURGERY Right 2019   APPENDECTOMY     arm surgery Right    CAROTID PTA/STENT INTERVENTION Right 04/14/2024   Procedure: CAROTID PTA/STENT INTERVENTION;  Surgeon: Marea Selinda RAMAN, MD;  Location: [facility];  Service: Cardiovascular;  Laterality: Right;  EYE SURGERY       Social History   Tobacco Use   Smoking status: Former    Current packs/day: 0.00    Types: Cigarettes    Quit date: 1983    Years since quitting: 42.8   Smokeless tobacco: Never  Vaping Use   Vaping status: Never Used  Substance Use Topics   Alcohol use: Yes    Comment: social drinker   Drug use: Never      Family History  Problem Relation Age of Onset   Aortic stenosis Mother    Dementia Father    Congestive Heart Failure Brother    Kidney disease Brother    Suicidality Brother      Allergies  Allergen Reactions   Erythromycin Hives    Mouth breaks out     REVIEW OF SYSTEMS (Negative unless checked)   Constitutional: [] Weight loss  [] Fever  [] Chills Cardiac: [] Chest pain   [] Chest pressure   [] Palpitations   [] Shortness of breath when laying flat   [] Shortness of breath at rest   [] Shortness of breath with  exertion. Vascular:  [] Pain in legs with walking   [] Pain in legs at rest   [] Pain in legs when laying flat   [] Claudication   [] Pain in feet when walking  [] Pain in feet at rest  [] Pain in feet when laying flat   [] History of DVT   [] Phlebitis   [] Swelling in legs   [] Varicose veins   [] Non-healing ulcers Pulmonary:   [] Uses home oxygen   [] Productive cough   [] Hemoptysis   [] Wheeze  [] COPD   [] Asthma Neurologic:  [] Dizziness  [] Blackouts   [] Seizures   [] History of stroke   [] History of TIA  [] Aphasia   [] Temporary blindness   [] Dysphagia   [] Weakness or numbness in arms   [] Weakness or numbness in legs Musculoskeletal:  [x] Arthritis   [] Joint swelling   [x] Joint pain   [] Low back pain Hematologic:  [] Easy bruising  [] Easy bleeding   [] Hypercoagulable state   [] Anemic   Gastrointestinal:  [] Blood in stool   [] Vomiting blood  [x] Gastroesophageal reflux/heartburn   [] Abdominal pain Genitourinary:  [x] Chronic kidney disease   [] Difficult urination  [] Frequent urination  [] Burning with urination   [] Hematuria Skin:  [] Rashes   [] Ulcers   [] Wounds Psychological:  [] History of anxiety   []  History of major depression.  Physical Examination  BP (!) 92/58   Pulse 70   Resp 18   Ht 5' 6 (1.676 m)   Wt 115 lb 6.4 oz (52.3 kg)   BMI 18.63 kg/m  Gen:  WD/WN, NAD.  Appears decades younger than stated age Head: [place]/AT, No temporalis wasting. Ear/Nose/Throat: Hearing grossly intact, nares w/o erythema or drainage Eyes: Conjunctiva clear. Sclera non-icteric Neck: Supple.  Trachea midline Pulmonary:  Good air movement, no use of accessory muscles.  Cardiac: RRR, no JVD Vascular:  Vessel Right Left  Radial Palpable Palpable           Musculoskeletal: M/S 5/5 throughout.  No deformity or atrophy.  No edema. Neurologic: Sensation grossly intact in extremities.  Symmetrical.  Speech is fluent.  Psychiatric: Judgment intact, Mood & affect appropriate for pt's clinical situation. Dermatologic: No  rashes or ulcers noted.  No cellulitis or open wounds.      Labs No results found for this or any previous visit (from the past 2160 hours).  Radiology No results found.  Assessment/Plan  Bilateral carotid artery stenosis Carotid duplex today shows a widely patent right  carotid stent and 1 to 39% left ICA stenosis.  She will continue her aspirin , Plavix , and Pravachol .  If she needs to stop her Plavix  for a hip replacement, she is beyond 90 days from the procedure so that would be acceptable.  I would hold it 5 days preoperatively and resume following the procedure whenever felt safe by her orthopedic surgeon.  We will plan a follow-up visit in 6 months.  Hypertensive disorder blood pressure control important in reducing the progression of atherosclerotic disease. On appropriate oral medications.     Pure hypercholesterolemia lipid control important in reducing the progression of atherosclerotic disease. Continue statin therapy   Selinda Gu, MD  08/12/2024 2:59 PM    This note was created with Dragon medical transcription system.  Any errors from dictation are purely unintentional

## 2024-08-25 ENCOUNTER — Encounter: Payer: Self-pay | Admitting: Radiology

## 2024-09-15 ENCOUNTER — Other Ambulatory Visit (INDEPENDENT_AMBULATORY_CARE_PROVIDER_SITE_OTHER): Payer: Self-pay | Admitting: Nurse Practitioner

## 2025-02-10 ENCOUNTER — Ambulatory Visit (INDEPENDENT_AMBULATORY_CARE_PROVIDER_SITE_OTHER): Admitting: Vascular Surgery

## 2025-02-10 ENCOUNTER — Encounter (INDEPENDENT_AMBULATORY_CARE_PROVIDER_SITE_OTHER)
